# Patient Record
Sex: Female | Born: 1960 | Race: Black or African American | Hispanic: No | Marital: Married | Smoking: Never smoker
Health system: Southern US, Community
[De-identification: ages and names within clinical notes are randomized; demographics above are authoritative.]

## PROBLEM LIST (undated history)

## (undated) DIAGNOSIS — T8859XA Other complications of anesthesia, initial encounter: Secondary | ICD-10-CM

## (undated) DIAGNOSIS — S83241D Other tear of medial meniscus, current injury, right knee, subsequent encounter: Secondary | ICD-10-CM

## (undated) DIAGNOSIS — F419 Anxiety disorder, unspecified: Secondary | ICD-10-CM

## (undated) DIAGNOSIS — F32A Depression, unspecified: Secondary | ICD-10-CM

## (undated) DIAGNOSIS — F329 Major depressive disorder, single episode, unspecified: Secondary | ICD-10-CM

## (undated) DIAGNOSIS — I10 Essential (primary) hypertension: Secondary | ICD-10-CM

## (undated) DIAGNOSIS — T4145XA Adverse effect of unspecified anesthetic, initial encounter: Secondary | ICD-10-CM

## (undated) HISTORY — PX: TUBAL LIGATION: SHX77

---

## 1999-03-22 ENCOUNTER — Other Ambulatory Visit: Admission: RE | Admit: 1999-03-22 | Discharge: 1999-03-22 | Payer: Self-pay | Admitting: Obstetrics and Gynecology

## 1999-05-15 ENCOUNTER — Encounter: Payer: Self-pay | Admitting: Obstetrics and Gynecology

## 1999-05-15 ENCOUNTER — Ambulatory Visit (HOSPITAL_COMMUNITY): Admission: RE | Admit: 1999-05-15 | Discharge: 1999-05-15 | Payer: Self-pay | Admitting: Obstetrics and Gynecology

## 1999-09-26 ENCOUNTER — Encounter (INDEPENDENT_AMBULATORY_CARE_PROVIDER_SITE_OTHER): Payer: Self-pay | Admitting: Specialist

## 1999-09-26 ENCOUNTER — Inpatient Hospital Stay (HOSPITAL_COMMUNITY): Admission: AD | Admit: 1999-09-26 | Discharge: 1999-09-28 | Payer: Self-pay | Admitting: Obstetrics and Gynecology

## 2004-01-13 ENCOUNTER — Other Ambulatory Visit: Admission: RE | Admit: 2004-01-13 | Discharge: 2004-01-13 | Payer: Self-pay | Admitting: Obstetrics and Gynecology

## 2004-01-19 ENCOUNTER — Encounter (INDEPENDENT_AMBULATORY_CARE_PROVIDER_SITE_OTHER): Payer: Self-pay | Admitting: *Deleted

## 2004-01-19 ENCOUNTER — Ambulatory Visit (HOSPITAL_COMMUNITY): Admission: RE | Admit: 2004-01-19 | Discharge: 2004-01-19 | Payer: Self-pay | Admitting: Obstetrics and Gynecology

## 2004-07-24 ENCOUNTER — Ambulatory Visit (HOSPITAL_COMMUNITY): Admission: RE | Admit: 2004-07-24 | Discharge: 2004-07-24 | Payer: Self-pay | Admitting: Family Medicine

## 2004-09-01 ENCOUNTER — Ambulatory Visit (HOSPITAL_COMMUNITY): Admission: RE | Admit: 2004-09-01 | Discharge: 2004-09-01 | Payer: Self-pay | Admitting: Family Medicine

## 2004-09-04 ENCOUNTER — Other Ambulatory Visit: Admission: RE | Admit: 2004-09-04 | Discharge: 2004-09-04 | Payer: Self-pay | Admitting: Obstetrics and Gynecology

## 2004-12-07 ENCOUNTER — Ambulatory Visit (HOSPITAL_COMMUNITY): Admission: RE | Admit: 2004-12-07 | Discharge: 2004-12-07 | Payer: Self-pay | Admitting: Urology

## 2005-07-25 ENCOUNTER — Ambulatory Visit (HOSPITAL_COMMUNITY): Admission: RE | Admit: 2005-07-25 | Discharge: 2005-07-25 | Payer: Self-pay | Admitting: Orthopedic Surgery

## 2005-07-27 ENCOUNTER — Encounter: Payer: Self-pay | Admitting: Family Medicine

## 2005-10-31 ENCOUNTER — Ambulatory Visit (HOSPITAL_COMMUNITY): Admission: RE | Admit: 2005-10-31 | Discharge: 2005-10-31 | Payer: Self-pay | Admitting: Orthopedic Surgery

## 2005-11-01 ENCOUNTER — Ambulatory Visit (HOSPITAL_COMMUNITY): Admission: RE | Admit: 2005-11-01 | Discharge: 2005-11-02 | Payer: Self-pay | Admitting: Neurosurgery

## 2006-10-29 HISTORY — PX: CERVICAL FUSION: SHX112

## 2007-11-13 ENCOUNTER — Ambulatory Visit: Payer: Self-pay | Admitting: Family Medicine

## 2007-12-05 ENCOUNTER — Ambulatory Visit (HOSPITAL_COMMUNITY): Admission: RE | Admit: 2007-12-05 | Discharge: 2007-12-05 | Payer: Self-pay | Admitting: Family Medicine

## 2008-01-07 DIAGNOSIS — R109 Unspecified abdominal pain: Secondary | ICD-10-CM | POA: Insufficient documentation

## 2008-01-07 DIAGNOSIS — E663 Overweight: Secondary | ICD-10-CM | POA: Insufficient documentation

## 2008-02-19 ENCOUNTER — Encounter: Payer: Self-pay | Admitting: Family Medicine

## 2008-02-19 LAB — CONVERTED CEMR LAB
ALT: 8 units/L (ref 0–35)
Albumin: 4.4 g/dL (ref 3.5–5.2)
Alkaline Phosphatase: 64 units/L (ref 39–117)
Basophils Absolute: 0 10*3/uL (ref 0.0–0.1)
Basophils Relative: 0 % (ref 0–1)
CO2: 23 meq/L (ref 19–32)
Calcium: 9.4 mg/dL (ref 8.4–10.5)
Cholesterol: 131 mg/dL (ref 0–200)
Creatinine, Ser: 0.73 mg/dL (ref 0.40–1.20)
Eosinophils Absolute: 0.1 10*3/uL (ref 0.0–0.7)
Eosinophils Relative: 3 % (ref 0–5)
Glucose, Bld: 96 mg/dL (ref 70–99)
HCT: 39.1 % (ref 36.0–46.0)
Lymphocytes Relative: 40 % (ref 12–46)
Lymphs Abs: 1.8 10*3/uL (ref 0.7–4.0)
MCHC: 32 g/dL (ref 30.0–36.0)
Monocytes Relative: 8 % (ref 3–12)
Platelets: 205 10*3/uL (ref 150–400)
RDW: 13.2 % (ref 11.5–15.5)
TSH: 1.371 microintl units/mL (ref 0.350–5.50)
Total Bilirubin: 0.5 mg/dL (ref 0.3–1.2)
Total CHOL/HDL Ratio: 2.8
Triglycerides: 60 mg/dL (ref <150)
VLDL: 12 mg/dL (ref 0–40)
WBC: 4.5 10*3/uL (ref 4.0–10.5)

## 2009-09-06 ENCOUNTER — Ambulatory Visit: Payer: Self-pay | Admitting: Family Medicine

## 2009-09-06 DIAGNOSIS — R5381 Other malaise: Secondary | ICD-10-CM | POA: Insufficient documentation

## 2009-09-06 DIAGNOSIS — R5383 Other fatigue: Secondary | ICD-10-CM | POA: Insufficient documentation

## 2009-09-06 DIAGNOSIS — R42 Dizziness and giddiness: Secondary | ICD-10-CM | POA: Insufficient documentation

## 2009-09-13 ENCOUNTER — Ambulatory Visit (HOSPITAL_COMMUNITY): Admission: RE | Admit: 2009-09-13 | Discharge: 2009-09-13 | Payer: Self-pay | Admitting: Family Medicine

## 2009-09-20 ENCOUNTER — Encounter: Payer: Self-pay | Admitting: Family Medicine

## 2009-10-31 ENCOUNTER — Encounter: Payer: Self-pay | Admitting: Family Medicine

## 2009-10-31 LAB — CONVERTED CEMR LAB
ALT: 8 units/L (ref 0–35)
AST: 13 units/L (ref 0–37)
Alkaline Phosphatase: 67 units/L (ref 39–117)
BUN: 10 mg/dL (ref 6–23)
Basophils Absolute: 0 10*3/uL (ref 0.0–0.1)
Bilirubin, Direct: 0.1 mg/dL (ref 0.0–0.3)
Calcium: 9 mg/dL (ref 8.4–10.5)
Creatinine, Ser: 0.67 mg/dL (ref 0.40–1.20)
Glucose, Bld: 84 mg/dL (ref 70–99)
HCT: 36.2 % (ref 36.0–46.0)
Hemoglobin: 12.4 g/dL (ref 12.0–15.0)
Lymphocytes Relative: 45 % (ref 12–46)
Lymphs Abs: 2.1 10*3/uL (ref 0.7–4.0)
MCHC: 34.3 g/dL (ref 30.0–36.0)
Monocytes Absolute: 0.4 10*3/uL (ref 0.1–1.0)
Neutro Abs: 2.1 10*3/uL (ref 1.7–7.7)
Platelets: 226 10*3/uL (ref 150–400)
Potassium: 4 meq/L (ref 3.5–5.3)
RBC: 4.24 M/uL (ref 3.87–5.11)
RDW: 13 % (ref 11.5–15.5)
Sodium: 142 meq/L (ref 135–145)
Total Bilirubin: 0.4 mg/dL (ref 0.3–1.2)
Triglycerides: 51 mg/dL (ref <150)
WBC: 4.6 10*3/uL (ref 4.0–10.5)

## 2009-11-01 ENCOUNTER — Ambulatory Visit: Payer: Self-pay | Admitting: Family Medicine

## 2009-12-22 ENCOUNTER — Encounter (INDEPENDENT_AMBULATORY_CARE_PROVIDER_SITE_OTHER): Payer: Self-pay | Admitting: *Deleted

## 2010-11-18 ENCOUNTER — Encounter: Payer: Self-pay | Admitting: Urology

## 2010-11-19 ENCOUNTER — Encounter: Payer: Self-pay | Admitting: Family Medicine

## 2010-11-28 NOTE — Assessment & Plan Note (Signed)
Summary: OFFICE VISIT   Vital Signs:  Patient profile:   50 year old female Menstrual status:  regular LMP:     11/01/2009 Height:      64.5 inches Weight:      208 pounds BMI:     35.28 O2 Sat:      97 % Pulse rate:   68 / minute Pulse rhythm:   regular Resp:     16 per minute BP sitting:   120 / 82 Cuff size:   large  Vitals Entered By: Everitt Amber (November 01, 2009 11:19 AM)  Nutrition Counseling: Patient's BMI is greater than 25 and therefore counseled on weight management options. CC: Follow up chronic problems LMP (date): 11/01/2009     Enter LMP: 11/01/2009   Primary Care Provider:  Syliva Overman MD  CC:  Follow up chronic problems.  History of Present Illness: Pt reports acute left flank pain 2 days ago, this is the first episode. She ststes it was colicky in nature, she is currently on her menses, so is unable to report any associated hematuria. The pain was not aggravated by movememnt, and appeared to radiate to her groin. she denies any associated fever, chills, dysuria or frequency. She still hs to schedule an appt to fully evaluate her vertigo. Sincwe her last visit here she has not had any severe recurrences, nor has she had to use the tramadol for severe abdominal pain. she repoorts a commitment to daily exercise for at lest since jher last visit. She feels better. She does not want to weigh, by her report, her caloric intake is very little.   Current Medications (verified): 1)  Antivert 25 Mg Tabs (Meclizine Hcl) .... Take 1 Tablet By Mouth Three Times A Day As Needed 2)  Tramadol Hcl 50 Mg Tabs (Tramadol Hcl) .... One To Two Tablets At Night As Needed  Allergies (verified): 1)  ! * Shrimp  Review of Systems      See HPI General:  Denies chills, fatigue, and fever. Eyes:  Denies blurring and discharge. ENT:  Denies hoarseness, nasal congestion, and sinus pressure. CV:  Denies chest pain or discomfort, palpitations, and swelling of  feet. Resp:  Denies cough and sputum productive. GU:  Denies dysuria and urinary frequency.  Physical Exam  General:  Well-developed,ovewrweight appearing,in no acute distress; alert,appropriate and cooperative throughout examination HEENT: No facial asymmetry,  EOMI, No sinus tenderness, TM's Clear, oropharynx  pink and moist.   Chest: Clear to auscultation bilaterally.  CVS: S1, S2, No murmurs, No S3.   Abd: Soft, Nontender. Left renal angle slightly tender MS: Adequate ROM spine, hips, shoulders and knees.  Ext: No edema.   CNS: CN 2-12 intact, power tone and sensation normal throughout.   Skin: Intact, no visible lesions or rashes.  Psych: Good eye contact, normal affect.  Memory intact, not anxious or depressed appearing.    Impression & Recommendations:  Problem # 1:  FLANK PAIN, LEFT (ICD-789.09) Assessment Comment Only  Her updated medication list for this problem includes:    Tramadol Hcl 50 Mg Tabs (Tramadol hcl) ..... One to two tablets at night as needed  Orders: Radiology Referral (Radiology)  Problem # 2:  VERTIGO (ICD-780.4) Assessment: Unchanged  Her updated medication list for this problem includes:    Antivert 25 Mg Tabs (Meclizine hcl) .Marland Kitchen... Take 1 tablet by mouth three times a day as needed  Problem # 3:  PELVIC PAIN, CHRONIC (ICD-789.09) Assessment: Unchanged  Her updated  medication list for this problem includes:    Tramadol Hcl 50 Mg Tabs (Tramadol hcl) ..... One to two tablets at night as needed  Problem # 4:  OBESITY, MILD (ICD-278.02) Assessment: Comment Only  Ht: 64.5 (11/01/2009)   Wt: 208 (11/01/2009)   BMI: 35.28 (11/01/2009)  Complete Medication List: 1)  Antivert 25 Mg Tabs (Meclizine hcl) .... Take 1 tablet by mouth three times a day as needed 2)  Tramadol Hcl 50 Mg Tabs (Tramadol hcl) .... One to two tablets at night as needed  Patient Instructions: 1)  F/U as before 2)  It is important that you exercise regularly at least 30  minutes75 times a week. If you develop chest pain, have severe difficulty breathing, or feel very tired , stop exercising immediately and seek medical attention.  CONGRATS on your new activity. 3)  You will be referred for an ultrasound of your left flank to evaluate the flank pain.

## 2010-11-28 NOTE — Letter (Signed)
Summary: 1st Missed Appt.  [facility]  [address]   [place], [place] [address]   Phone: [phone]  Fax: [phone]    December 22, 2009  MRN: [id number]  [facility] [place] [address] [place], [place]  [address]  Dear Ms. Madera-BAKER,  At [facility] [place], we make every attempt to fit patients into our schedule by reserving several appointment slots for same-day appointments.  However, we cannot always make appointments for patients the same day they are calling.  At the end of the day, we look back at our schedule and find that because of last-minute cancellations and patients not showing up for their scheduled appointments, we have several appointment slots that are left open and could have been used by another person who really needed it.  In the past, you may have been one of the patients who could not get in when you needed to.  But recently, you were one of the patients with an appointment that you didn't show up for or canceled too late for [place] to fill it.  We choose not to charge no-show or last minute cancellation fees to our patients, like many other offices do.  We do not wish to institute that policy and hope we never have to.  However, we kindly request that you assist [place] by providing at least 24 hours' notice if you can't make your appointment.  If no-shows or late cancellations become habitual (i.e. Three or more in a one-year period), we may terminate the physician-patient relationship.    Thank you for your consideration and cooperation.   Altamease Oiler

## 2011-03-16 NOTE — Op Note (Signed)
NAME:  Deborah Kennedy, Deborah Kennedy                     ACCOUNT NO.:  [id number]   MEDICAL RECORD NO.:  [id number]                   PATIENT TYPE:  AMB   LOCATION:  DAY                                  FACILITY:  [facility]   PHYSICIAN:  Zenaida Niece, M.D.             DATE OF BIRTH:  [birth year]   DATE OF PROCEDURE:  01/19/2004  DATE OF DISCHARGE:                                 OPERATIVE REPORT   PREOPERATIVE DIAGNOSIS:  Pelvic pain and right ovarian lesion.   POSTOPERATIVE DIAGNOSIS:  Pelvic pain, uterine myomas and right ovarian  lesion.   PROCEDURE:  Laparoscopy with minimal adhesiolysis and fulguration of  previous site of tubal ligation.  Removal of right ovarian lesion.   SURGEON:  Zenaida Niece, M.D.   ANESTHESIA:  General endotracheal.   ESTIMATED BLOOD LOSS:  Less than 50 cc.   FINDINGS:  Showed a 2-to-3-cm, right, fundal myoma; a 1-to-2-cm, irregular  nodule medially on the right ovary.  Left ovary appeared normal.  Both tubes  had evidence of previous tubal ligation, but the ends of the tubes appeared  fairly closely approximated.  She had a normal appendix, liver edge,  gallbladder, and upper abdomen.   PROCEDURE IN DETAIL:  The patient was taken to the operating room and placed  in the dorsal supine position. General anesthesia was induced and she was  placed in mobile stirrups.  Abdomen was prepped and draped in the usual  sterile fashion.  Bladder drained with a red rubber catheter.  Hulka  tenaculum applied to the cervix for uterine manipulation.  Infraumbilical  skin was infiltrated with 1/4% Marcaine and a 1.5 cm horizontal incision was  made along her previous scar. The 10-11, disposable trocar was then  introduced and placement confirmed by the laparoscope.  A 5 mm port was then  placed under direct visualization, low in the midline.  Inspection revealed  the above-mentioned findings.  Appendix was freely mobile.  Right ovary was  mobile without adhesions and  did have a 1-2 cm nodule medially.  The middle  of the tube was adherent to the pelvic sidewall and this was taken down  sharply.  I could see where the tubal had been performed, but the ends were  closely approximated.  Both sides of this previous ligation were fulgurated  cautery to assure sterility.   The anterior cul-de-sac, posterior cul-de-sac, and ovarian fossa appeared  normal without evidence of endometriosis.  The left tube and ovary appeared  normal, again with closely approximated ends of the previously ligated tube.  On this side, also both ends of the tube that had been previously ligated  were fulgurated with bipolar cautery.   Attention was turned to the lesion on the right ovary.  This was grasped  with a clamp with teeth.  Bipolar cautery was used to take an initial  incision into the ovary.  Sharp scissors were then used to remove the nodule  sharply.  The ovary was then coagulated with bipolar cautery for hemostasis  with good hemostasis achieved.  Inspection revealed no further lesions.  The  ovarian nodule that was removed was grasped through the operating scope and  removed with the trocar in 2 pieces.  This was sent for final pathology.  The trocar was then reintroduced into the abdomen without difficulty and  visualization revealed all sites to be hemostatic.  The 5 mm port was then  removed under direct visualization.  The laparoscope was removed and all gas  allowed to deflate from the abdomen.  The umbilical trocar was then removed.   All skin incisions were closed with interrupted subcuticular sutures of 4-0  Vicryl, followed by Steri-Strips and Band-Aids.  The Hulka tenaculum was  then removed from the cervix. The patient was awakened in the operating  room, tolerated the procedure well and was taken to the recovery room in  stable condition.  Counts were correct x2 and she received no antibiotics.                                               Zenaida Niece, M.D.    TDM/MEDQ  D:  01/19/2004  T:  01/20/2004  Job:  [id number]

## 2011-03-16 NOTE — Op Note (Signed)
[facility]  Patient:    Deborah Kennedy               MRN: [id number] Proc. Date: 09/27/99 Adm. Date:  [id number] Attending:  Michaele Offer                           Operative Report  PREOPERATIVE DIAGNOSIS:       Desired surgical sterility.  POSTOPERATIVE DIAGNOSIS:      Desired surgical sterility.  OPERATION:                    Bilateral partial salpingectomy.  SURGEON:                      Zenaida Niece, M.D.  ASSISTANT:  ANESTHESIA:                   Epidural.  ESTIMATED BLOOD LOSS:         Less than 50 cc.  FINDINGS:                     Normal anatomy.  COUNTS:                       Correct.  CONDITION:                    Stable.  DESCRIPTION OF PROCEDURE:     After appropriate informed consent was obtained, he patient was taken to the operating room and placed in the dorsal supine position. Her previously placed epidural was dosed up appropriately and her abdomen prepped and draped in the usual sterile fashion.  The level of her anesthesia was found to be adequate and the infraumbilical skin was infiltrated with 0.25% Marcaine.  A 3 cm horizontal skin incision was made, the fascia was identified and entered sharply followed by the peritoneal cavity.  The bowels were packed back with a moist lap pad and both fallopian tubes were identified and traced to their fimbriated ends. A knuckle of tube was grasped on each side with Babcock clamp and ligated with 0 plain gut suture.  The knuckle of tube was removed on each side sharply, both ostia identified, and both stumps were hemostatic.  The fascia and peritoneum were then closed in a running fashion with 0 Vicryl and the skin closed with a running subcuticular suture of 4-0 Vicryl.  A sterile dressing was applied.  The patient tolerated the procedure well and was taken to the recovery room in stable condition. DD:  09/27/99 TD:  09/27/99 Job:  [id number]

## 2011-03-16 NOTE — Op Note (Signed)
Deborah Kennedy, Deborah Kennedy     ACCOUNT NO.:  [id number]   MEDICAL RECORD NO.:  [id number]          PATIENT TYPE:  OIB   LOCATION:  3022                         FACILITY:  [facility]   PHYSICIAN:  Danae Orleans. Venetia Maxon, M.D.  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   OPERATION PERFORMED:  Anterior cervical decompression and fusion C5-6 level  with PEEK interbody cage, morcellized bone autograft and anterior cervical  plate.   SURGEON:  Danae Orleans. Venetia Maxon, M.D.   ASSISTANT:  Coletta Memos, M.D.   ANESTHESIA:  General endotracheal.   ESTIMATED BLOOD LOSS:  Minimal.   COMPLICATIONS:  None.   DISPOSITION:  Recovery.   INDICATIONS FOR PROCEDURE:  Deborah Kennedy is a 50 year old woman  with a large herniated disk at C5-6 with severe left arm pain and weakness.  It was elected to take her to surgery for anterior cervical decompression  and fusion.   DESCRIPTION OF PROCEDURE:  Deborah Kennedy was brought to the operating  room.  Following satisfactory and uncomplicated induction of general  endotracheal anesthesia and placement of intravenous lines, the patient was  placed in supine position on the operating table.  The neck was placed in  slight extension and she was placed in 10 pounds of halter traction.  The  anterior neck was then prepped and draped in the usual sterile fashion.  The  area of planned incision was infiltrated with 0.25% Marcaine and 0.5%  lidocaine 1:200,000 epinephrine.  Incision was made from the midline to the  anterior border of the sternocleidomastoid muscle on the left side of  midline and carried to the platysma  layer.  Subplatysmal dissection was  performed exposing the anterior border of the sternocleidomastoid muscle.  Using blunt dissection, the carotid sheath was kept lateral, the trachea and  esophagus kept medial exposing the anterior cervical spine.  A bent spinal  needle was placed at what was felt to be the C5-6 level and this was  confirmed on intraoperative x-ray.  Subsequently, the longus colli muscles  were taken down from the anterior cervical spine from C5 through C6  bilaterally using electrocautery and Key elevator.  Self-retaining retractor  was placed to facilitate exposure and the C5-6 level was then incised with a  15 blade and disk material was removed in piecemeal fashion.  End plates  were stripped of residual disk material and the microscope was brought into  the field and using high power microscopic visualization, the end plates of  C5 and C6 were decorticated and uncinate spurs were drilled down.  The  posterior longitudinal ligament was then incised with an arachnoid knife and  was removed decompressing the central spinal cord dura, initially the right  neural foramen and subsequently the left neural foramen.  On the left side  of midline there was a large disk herniation and multiple fragments of  herniated disk material were removed with resultant significant  decompression of the left side of the spinal cord and also the left C6 nerve  root.  This was decompressed widely as it extended out the neural foramen.  Hemostasis was assured with Gelfoam soaked in Thrombin.  A nerve hook was  passed easily over the nerve root. It was felt to be well decompressed.  Subsequently after trial sizing, a 7 mm PEEK interbody cage was selected,  packed with morcellized bone autograft which was retained from the drillings  of the end plates. This was then inserted in the interspace and countersunk  appropriately.  A 14 mm anterior cervical plate was then affixed to the  anterior  cervical spine using 14 mm variable angle screws, two at C5 and two  at C6.  All screws had excellent purchase.  Locking mechanisms were engaged.  Final x-ray demonstrated well positioned interbody graft and anterior  cervical plate.  Prior to placing the screws, the halter traction was  removed.  Hemostasis of the soft tissues was obtained with Bovie  electrocautery and soft tissues were inspected and found to be in good  repair.  Subsequently, the platysma layer was reapproximated with 3-0 Vicryl  sutures and skin edges were reapproximated with interrupted 3-0 Vicryl  subcuticular stitch.  The wound was dressed with Dermabond.  The patient was  extubated in the operating room and taken to the recovery room in stable and  satisfactory condition having tolerated the operation well.  Counts were  correct at the end of the case.      Danae Orleans. Venetia Maxon, M.D.  Electronically Signed     JDS/MEDQ  D:  11/01/2005  T:  11/02/2005  Job:  [id number]

## 2011-05-03 ENCOUNTER — Other Ambulatory Visit: Payer: Self-pay | Admitting: Obstetrics and Gynecology

## 2011-05-03 ENCOUNTER — Other Ambulatory Visit (HOSPITAL_COMMUNITY)
Admission: RE | Admit: 2011-05-03 | Discharge: 2011-05-03 | Disposition: A | Discharge status: Home / Self Care | Payer: 59 | Source: Ambulatory Visit | Attending: Obstetrics and Gynecology | Admitting: Obstetrics and Gynecology

## 2011-05-03 DIAGNOSIS — Z1159 Encounter for screening for other viral diseases: Secondary | ICD-10-CM | POA: Insufficient documentation

## 2011-05-03 DIAGNOSIS — Z124 Encounter for screening for malignant neoplasm of cervix: Secondary | ICD-10-CM | POA: Insufficient documentation

## 2011-08-15 ENCOUNTER — Other Ambulatory Visit: Payer: Self-pay | Admitting: Orthopedic Surgery

## 2011-08-15 DIAGNOSIS — M66879 Spontaneous rupture of other tendons, unspecified ankle and foot: Secondary | ICD-10-CM

## 2011-08-17 ENCOUNTER — Ambulatory Visit (HOSPITAL_COMMUNITY)
Admission: RE | Admit: 2011-08-17 | Discharge: 2011-08-17 | Disposition: A | Discharge status: Home / Self Care | Payer: 59 | Source: Ambulatory Visit | Attending: Orthopedic Surgery | Admitting: Orthopedic Surgery

## 2011-08-17 DIAGNOSIS — M659 Synovitis and tenosynovitis, unspecified: Secondary | ICD-10-CM | POA: Insufficient documentation

## 2011-08-17 DIAGNOSIS — M65979 Unspecified synovitis and tenosynovitis, unspecified ankle and foot: Secondary | ICD-10-CM | POA: Insufficient documentation

## 2011-08-17 DIAGNOSIS — M25579 Pain in unspecified ankle and joints of unspecified foot: Secondary | ICD-10-CM | POA: Insufficient documentation

## 2011-08-17 DIAGNOSIS — M7660 Achilles tendinitis, unspecified leg: Secondary | ICD-10-CM | POA: Insufficient documentation

## 2011-08-17 DIAGNOSIS — M66879 Spontaneous rupture of other tendons, unspecified ankle and foot: Secondary | ICD-10-CM

## 2011-10-08 ENCOUNTER — Other Ambulatory Visit: Payer: Self-pay | Admitting: Obstetrics and Gynecology

## 2011-10-09 ENCOUNTER — Encounter (HOSPITAL_COMMUNITY): Payer: Self-pay | Admitting: Pharmacy Technician

## 2011-10-11 ENCOUNTER — Encounter (HOSPITAL_COMMUNITY): Admission: RE | Admit: 2011-10-11 | Payer: 59 | Source: Ambulatory Visit

## 2011-10-15 ENCOUNTER — Encounter (HOSPITAL_COMMUNITY): Payer: Self-pay

## 2011-10-15 ENCOUNTER — Other Ambulatory Visit: Payer: Self-pay | Admitting: Obstetrics and Gynecology

## 2011-10-15 ENCOUNTER — Encounter (HOSPITAL_COMMUNITY)
Admission: RE | Admit: 2011-10-15 | Discharge: 2011-10-15 | Disposition: A | Discharge status: Home / Self Care | Payer: 59 | Source: Ambulatory Visit | Attending: Obstetrics and Gynecology | Admitting: Obstetrics and Gynecology

## 2011-10-15 HISTORY — DX: Other complications of anesthesia, initial encounter: T88.59XA

## 2011-10-15 HISTORY — DX: Adverse effect of unspecified anesthetic, initial encounter: T41.45XA

## 2011-10-15 LAB — COMPREHENSIVE METABOLIC PANEL
Albumin: 3.8 g/dL (ref 3.5–5.2)
Alkaline Phosphatase: 79 U/L (ref 39–117)
CO2: 27 mEq/L (ref 19–32)
Calcium: 9.9 mg/dL (ref 8.4–10.5)
Chloride: 106 mEq/L (ref 96–112)
GFR calc Af Amer: >90 mL/min (ref >90)
GFR calc non Af Amer: >90 mL/min (ref >90)
Glucose, Bld: 106 mg/dL — ABNORMAL HIGH (ref 70–99)
Potassium: 4.1 mEq/L (ref 3.5–5.1)

## 2011-10-15 LAB — URINALYSIS, ROUTINE W REFLEX MICROSCOPIC
Bilirubin Urine: NEGATIVE
Glucose, UA: NEGATIVE mg/dL
Specific Gravity, Urine: 1.03 (ref 1.005–1.030)
Urobilinogen, UA: 0.2 mg/dL (ref 0.0–1.0)
pH: 6 (ref 5.0–8.0)

## 2011-10-15 LAB — URINE MICROSCOPIC-ADD ON

## 2011-10-15 LAB — CBC
MCH: 28.8 pg (ref 26.0–34.0)
MCV: 86.5 fL (ref 78.0–100.0)
RDW: 12.9 % (ref 11.5–15.5)
WBC: 4.4 10*3/uL (ref 4.0–10.5)

## 2011-10-15 LAB — SURGICAL PCR SCREEN
MRSA, PCR: NEGATIVE
Staphylococcus aureus: NEGATIVE

## 2011-10-15 LAB — TYPE AND SCREEN: ABO/RH(D): AB POS

## 2011-10-15 NOTE — Patient Instructions (Signed)
20 Greer Peppard-Baker  10/15/2011   Your procedure is scheduled on:  10/18/11  Report to [facility] at 0945 AM.  Call this number if you have problems the morning of surgery: [phone]   Remember:   Do not eat food:After Midnight.  May have clear liquids:until Midnight .  Clear liquids include soda, tea, black coffee, apple or grape juice, broth.  Take these medicines the morning of surgery with A SIP OF WATER: Tylenol   Do not wear jewelry, make-up or nail polish.  Do not wear lotions, powders, or perfumes. You may wear deodorant.  Do not shave 48 hours prior to surgery.  Do not bring valuables to the hospital.  Contacts, dentures or bridgework may not be worn into surgery.  Leave suitcase in the car. After surgery it may be brought to your room.  For patients admitted to the hospital, checkout time is 11:00 AM the day of discharge.   Patients discharged the day of surgery will not be allowed to drive home.  Name and phone number of your driver: husband  Special Instructions: CHG Shower Use Special Wash: 1/2 bottle night before surgery and 1/2 bottle morning of surgery.   Please read over the following fact sheets that you were given: Pain Booklet, Surgical Site Infection Prevention and Care and Recovery After Surgery\

## 2011-10-17 NOTE — H&P (Signed)
Deborah Kennedy is an 50 y.o. female. She is a gravida 2 para 2 status post tubal ligation with LMP 09/10/2011 she is admitted for abdominal hysterectomy due to symptomatic uterine fibroids which cause her chronic daily debilitating pain in the evenings for approximately 2-2-1/2 weeks just before each of her menses. She has one good week of pain-free existence [place]. The pain is reproduced by uterine contact. The uterus is enlarged at 16 weeks size. Ultrasound has been performed which shows the uterus to be 8.8 x 5.5 x 4.8 cm with an exophytic fundal fibroid measuring 8.8 cm in maximum diameter. Right and left ovaries are visualized and normal. Endometrial biopsy has been performed but and it was benign 10/09/2011. Pap smear was performed 05/03/2011 interpreted as ASCUS with negative HPV. This is treated as a normal. She has an enlarged bulbous cervix. She is admitted for abdominal hysterectomy. In order to improve access to the pelvis plans are to excise an ellipse of skin and underlying subcutaneous tissues along the Pfannenstiel incision. This should reduce postoperative scarring in skin overlap as well. This has been scant out with the patient who understands the alternatives risks and benefits to this portion of the procedure. The patient did not return preoperatively in time to consider suppression of the fibroids with Lupron. She desires to proceed at this time with surgery rather than delay surgery sufficiently to allow uterine suppression with Lupron.  Pertinent Gynecological History: Menses: flow is moderate, regular every month without intermenstrual spotting and usually lasting 5 to 6 days Bleeding:  Contraception: tubal ligation DES exposure: denies Blood transfusions: none Sexually transmitted diseases: no past history Previous GYN Procedures: tubal ligation  Last mammogram: normal Date: by pt hx Last pap: ASCUS - hpv Date: 05/03/2011 OB History: G2, P2002   Menstrual  History: Menarche age:  No LMP recorded. 09/10/2011    Past Medical History  Diagnosis Date  . Complication of anesthesia     reaction to epidural medication    Past Surgical History  Procedure Date  . Tubal ligation   . Cervical fusion 2008    No family history on file.  Social History:  reports that she has never smoked. She does not have any smokeless tobacco history on file. She reports that she does not drink alcohol or use illicit drugs.  Allergies: Not on File  No prescriptions prior to admission    ROS review of systems is positive for torn tendons in her right foot as a walking boot which she would has been wearing for several months she has declined to proceed with surgery so far but is considering that in the future  There were no vitals taken for this visit.  Physical Exam Physical Examination: General appearance - alert, well appearing, and in no distress, oriented to person, place, and time, overweight and anxious Mental status - alert, oriented to person, place, and time, normal mood, behavior, speech, dress, motor activity, and thought processes, anxious,  Mouth - mucous membranes moist, pharynx normal without lesions and dental hygiene good Chest - clear to auscultation, no wheezes, rales or rhonchi, symmetric air entry Heart - normal rate and regular rhythm Abdomen - tenderness noted in right lower quadrant when palpating over the top of the uterine fibroids which makes the uterine estimated size equal to 14-16 week size uterus the fibroids seem to fill the pelvis sufficiently laparoscopic procedure considered technically challenging bowel sounds normal Positioning of planned incision sketched out with patient Pelvic - VULVA: normal appearing vulva  with no masses, tenderness or lesions, VAGINA: normal appearing vagina with normal color and discharge, no lesions uterus enlarged as described above cervix multiparous large 4 cm transverse diameter  Extremities -  peripheral pulses normal, no pedal edema, no clubbing or cyanosis, Homan's sign negative bilaterally, patient has some swelling of the right ankle this chronic residual status post tendon injuries there is no suspicion of DVT at this time  No results found for this or any previous visit (from the past 24 hour(s)). CBC    Component Value Date/Time   WBC 4.4 10/15/2011 0923   RBC 4.37 10/15/2011 0923   HGB 12.6 10/15/2011 0923   HCT 37.8 10/15/2011 0923   PLT 225 10/15/2011 0923   MCV 86.5 10/15/2011 0923   MCH 28.8 10/15/2011 0923   MCHC 33.3 10/15/2011 0923   RDW 12.9 10/15/2011 0923   LYMPHSABS 2.1 10/31/2009 1842   MONOABS 0.4 10/31/2009 1842   EOSABS 0.1 10/31/2009 1842   BASOSABS 0.0 10/31/2009 1842    No results found.  Assessment/Plan: risks of procedure reviewed with patient. Ovarian preservation planned, with excision of skin and subcutaneous fat as needed improved pelvic access and incision positioning. Uterine fibroids 14-16 weeks size , symptomatic,scheduled for abdominal hysterectomy through a Pfannensteil incision.   Kassy Mcenroe V 10/17/2011, 1:27 PM

## 2011-10-18 ENCOUNTER — Encounter (HOSPITAL_COMMUNITY): Payer: Self-pay | Admitting: Anesthesiology

## 2011-10-18 ENCOUNTER — Other Ambulatory Visit: Payer: Self-pay | Admitting: Obstetrics and Gynecology

## 2011-10-18 ENCOUNTER — Ambulatory Visit (HOSPITAL_COMMUNITY): Payer: 59 | Admitting: Anesthesiology

## 2011-10-18 ENCOUNTER — Inpatient Hospital Stay (HOSPITAL_COMMUNITY)
Admission: RE | Admit: 2011-10-18 | Discharge: 2011-10-21 | DRG: 743 | Disposition: A | Discharge status: Home / Self Care | Payer: 59 | Source: Ambulatory Visit | Attending: Obstetrics and Gynecology | Admitting: Obstetrics and Gynecology

## 2011-10-18 ENCOUNTER — Encounter (HOSPITAL_COMMUNITY)
Admission: RE | Disposition: A | Discharge status: Home / Self Care | Payer: Self-pay | Source: Ambulatory Visit | Attending: Obstetrics and Gynecology

## 2011-10-18 ENCOUNTER — Encounter (HOSPITAL_COMMUNITY): Payer: Self-pay | Admitting: *Deleted

## 2011-10-18 DIAGNOSIS — D251 Intramural leiomyoma of uterus: Secondary | ICD-10-CM | POA: Diagnosis present

## 2011-10-18 DIAGNOSIS — D259 Leiomyoma of uterus, unspecified: Principal | ICD-10-CM | POA: Diagnosis present

## 2011-10-18 DIAGNOSIS — R1031 Right lower quadrant pain: Secondary | ICD-10-CM | POA: Diagnosis present

## 2011-10-18 HISTORY — PX: ABDOMINAL HYSTERECTOMY: SHX81

## 2011-10-18 SURGERY — HYSTERECTOMY, ABDOMINAL
Anesthesia: General | Site: Abdomen | Wound class: Clean Contaminated

## 2011-10-18 MED ORDER — KETOROLAC TROMETHAMINE 30 MG/ML IJ SOLN
INTRAMUSCULAR | Status: AC
  Administered 2011-10-18: 30 mg via INTRAVENOUS
  Filled 2011-10-18: qty 1

## 2011-10-18 MED ORDER — BUPIVACAINE HCL (PF) 0.5 % IJ SOLN
INTRAMUSCULAR | Status: AC
  Filled 2011-10-18: qty 30

## 2011-10-18 MED ORDER — KCL IN DEXTROSE-NACL 20-5-0.45 MEQ/L-%-% IV SOLN
INTRAVENOUS | Status: DC
  Administered 2011-10-18 – 2011-10-19 (×5): via INTRAVENOUS
  Administered 2011-10-20: 1000 mL via INTRAVENOUS

## 2011-10-18 MED ORDER — ROCURONIUM BROMIDE 50 MG/5ML IV SOLN
INTRAVENOUS | Status: AC
  Filled 2011-10-18: qty 1

## 2011-10-18 MED ORDER — MIDAZOLAM HCL 5 MG/5ML IJ SOLN
INTRAMUSCULAR | Status: DC | PRN
  Administered 2011-10-18: 2 mg via INTRAVENOUS

## 2011-10-18 MED ORDER — ONDANSETRON HCL 4 MG/2ML IJ SOLN
4.0000 mg | Freq: Once | INTRAMUSCULAR | Status: AC
  Administered 2011-10-18: 4 mg via INTRAVENOUS

## 2011-10-18 MED ORDER — HYDROMORPHONE 0.3 MG/ML IV SOLN
INTRAVENOUS | Status: AC
  Administered 2011-10-18: 16:00:00
  Filled 2011-10-18: qty 25

## 2011-10-18 MED ORDER — PROPOFOL 10 MG/ML IV EMUL
INTRAVENOUS | Status: AC
  Filled 2011-10-18: qty 20

## 2011-10-18 MED ORDER — PANTOPRAZOLE SODIUM 40 MG IV SOLR
40.0000 mg | Freq: Every day | INTRAVENOUS | Status: AC
  Administered 2011-10-18 – 2011-10-19 (×2): 40 mg via INTRAVENOUS
  Filled 2011-10-18 (×2): qty 40

## 2011-10-18 MED ORDER — SODIUM CHLORIDE 0.9 % IJ SOLN
INTRAMUSCULAR | Status: AC
  Filled 2011-10-18: qty 3

## 2011-10-18 MED ORDER — GLYCOPYRROLATE 0.2 MG/ML IJ SOLN
INTRAMUSCULAR | Status: DC | PRN
  Administered 2011-10-18: .4 mg via INTRAVENOUS

## 2011-10-18 MED ORDER — NEOSTIGMINE METHYLSULFATE 1 MG/ML IJ SOLN
INTRAMUSCULAR | Status: AC
  Filled 2011-10-18: qty 10

## 2011-10-18 MED ORDER — CEFAZOLIN SODIUM-DEXTROSE 2-3 GM-% IV SOLR
INTRAVENOUS | Status: AC
  Filled 2011-10-18: qty 50

## 2011-10-18 MED ORDER — GLYCOPYRROLATE 0.2 MG/ML IJ SOLN
INTRAMUSCULAR | Status: AC
  Filled 2011-10-18: qty 1

## 2011-10-18 MED ORDER — SODIUM CHLORIDE 0.9 % IJ SOLN: 9.0000 mL | INTRAMUSCULAR | Status: DC | PRN

## 2011-10-18 MED ORDER — MIDAZOLAM HCL 2 MG/2ML IJ SOLN
INTRAMUSCULAR | Status: AC
  Filled 2011-10-18: qty 2

## 2011-10-18 MED ORDER — DIPHENHYDRAMINE HCL 50 MG/ML IJ SOLN: 12.5000 mg | Freq: Four times a day (QID) | INTRAMUSCULAR | Status: DC | PRN

## 2011-10-18 MED ORDER — MIDAZOLAM HCL 2 MG/2ML IJ SOLN
1.0000 mg | INTRAMUSCULAR | Status: DC | PRN
  Administered 2011-10-18: 2 mg via INTRAVENOUS

## 2011-10-18 MED ORDER — ROCURONIUM BROMIDE 100 MG/10ML IV SOLN
INTRAVENOUS | Status: DC | PRN
  Administered 2011-10-18: 50 mg via INTRAVENOUS

## 2011-10-18 MED ORDER — FENTANYL CITRATE 0.05 MG/ML IJ SOLN
INTRAMUSCULAR | Status: AC
  Filled 2011-10-18: qty 5

## 2011-10-18 MED ORDER — NALOXONE HCL 0.4 MG/ML IJ SOLN: 0.4000 mg | INTRAMUSCULAR | Status: DC | PRN

## 2011-10-18 MED ORDER — FENTANYL CITRATE 0.05 MG/ML IJ SOLN: 25.0000 ug | INTRAMUSCULAR | Status: DC | PRN

## 2011-10-18 MED ORDER — SODIUM CHLORIDE 0.9 % IJ SOLN
INTRAMUSCULAR | Status: AC
  Administered 2011-10-18: 10 mL
  Filled 2011-10-18: qty 3

## 2011-10-18 MED ORDER — PROPOFOL 10 MG/ML IV BOLUS
INTRAVENOUS | Status: DC | PRN
  Administered 2011-10-18: 160 mg via INTRAVENOUS

## 2011-10-18 MED ORDER — HYDROMORPHONE 0.3 MG/ML IV SOLN
INTRAVENOUS | Status: DC
  Administered 2011-10-18 (×2): 0.6 mg via INTRAVENOUS
  Administered 2011-10-19: 0.3 mg via INTRAVENOUS
  Administered 2011-10-19 (×3): 0.6 mg via INTRAVENOUS
  Administered 2011-10-20 (×4): 0.3 mg via INTRAVENOUS
  Administered 2011-10-21: 0.6 mg via INTRAVENOUS

## 2011-10-18 MED ORDER — ONDANSETRON HCL 4 MG/2ML IJ SOLN
4.0000 mg | Freq: Four times a day (QID) | INTRAMUSCULAR | Status: DC | PRN
  Administered 2011-10-18: 4 mg via INTRAVENOUS
  Filled 2011-10-18: qty 2

## 2011-10-18 MED ORDER — KETOROLAC TROMETHAMINE 30 MG/ML IJ SOLN
30.0000 mg | Freq: Once | INTRAMUSCULAR | Status: AC
  Administered 2011-10-18: 30 mg via INTRAVENOUS

## 2011-10-18 MED ORDER — BUPIVACAINE HCL (PF) 0.5 % IJ SOLN
INTRAMUSCULAR | Status: DC | PRN
  Administered 2011-10-18: 20 mL

## 2011-10-18 MED ORDER — CEFAZOLIN SODIUM 1-5 GM-% IV SOLN
INTRAVENOUS | Status: DC | PRN
  Administered 2011-10-18: 2 g via INTRAVENOUS

## 2011-10-18 MED ORDER — FENTANYL CITRATE 0.05 MG/ML IJ SOLN
INTRAMUSCULAR | Status: DC | PRN
  Administered 2011-10-18: 50 ug via INTRAVENOUS
  Administered 2011-10-18 (×2): 100 ug via INTRAVENOUS
  Administered 2011-10-18 (×3): 50 ug via INTRAVENOUS
  Administered 2011-10-18: 100 ug via INTRAVENOUS

## 2011-10-18 MED ORDER — HYDROMORPHONE HCL PF 1 MG/ML IJ SOLN
INTRAMUSCULAR | Status: AC
  Filled 2011-10-18: qty 1

## 2011-10-18 MED ORDER — HYDROMORPHONE HCL PF 1 MG/ML IJ SOLN
0.5000 mg | INTRAMUSCULAR | Status: DC | PRN
  Administered 2011-10-18 (×5): 0.5 mg via INTRAVENOUS

## 2011-10-18 MED ORDER — LACTATED RINGERS IV SOLN
INTRAVENOUS | Status: DC
  Administered 2011-10-18: 1000 mL via INTRAVENOUS
  Administered 2011-10-18 (×2): via INTRAVENOUS

## 2011-10-18 MED ORDER — MIDAZOLAM HCL 2 MG/2ML IJ SOLN
INTRAMUSCULAR | Status: AC
  Administered 2011-10-18: 2 mg via INTRAVENOUS
  Filled 2011-10-18: qty 2

## 2011-10-18 MED ORDER — ONDANSETRON HCL 4 MG/2ML IJ SOLN
INTRAMUSCULAR | Status: AC
  Administered 2011-10-18: 4 mg via INTRAVENOUS
  Filled 2011-10-18: qty 2

## 2011-10-18 MED ORDER — PROMETHAZINE HCL 25 MG/ML IJ SOLN
12.5000 mg | INTRAMUSCULAR | Status: DC | PRN
  Filled 2011-10-18: qty 1

## 2011-10-18 MED ORDER — ZOLPIDEM TARTRATE 5 MG PO TABS: 5.0000 mg | ORAL_TABLET | Freq: Every evening | ORAL | Status: DC | PRN

## 2011-10-18 MED ORDER — OXYCODONE-ACETAMINOPHEN 5-325 MG PO TABS: 1.0000 | ORAL_TABLET | ORAL | Status: DC | PRN

## 2011-10-18 MED ORDER — CEFAZOLIN SODIUM-DEXTROSE 2-3 GM-% IV SOLR: 2.0000 g | INTRAVENOUS | Status: DC

## 2011-10-18 MED ORDER — LIDOCAINE HCL 1 % IJ SOLN
INTRAMUSCULAR | Status: DC | PRN
  Administered 2011-10-18: 25 mg via INTRADERMAL

## 2011-10-18 MED ORDER — 0.9 % SODIUM CHLORIDE (POUR BTL) OPTIME
TOPICAL | Status: DC | PRN
  Administered 2011-10-18: 2000 mL

## 2011-10-18 MED ORDER — DIPHENHYDRAMINE HCL 12.5 MG/5ML PO ELIX: 12.5000 mg | ORAL_SOLUTION | Freq: Four times a day (QID) | ORAL | Status: DC | PRN

## 2011-10-18 MED ORDER — DOCUSATE SODIUM 100 MG PO CAPS
100.0000 mg | ORAL_CAPSULE | Freq: Two times a day (BID) | ORAL | Status: DC
  Administered 2011-10-19 – 2011-10-21 (×5): 100 mg via ORAL
  Filled 2011-10-18 (×6): qty 1

## 2011-10-18 MED ORDER — KETOROLAC TROMETHAMINE 30 MG/ML IJ SOLN: 30.0000 mg | Freq: Four times a day (QID) | INTRAMUSCULAR | Status: AC

## 2011-10-18 MED ORDER — HYDROMORPHONE HCL PF 1 MG/ML IJ SOLN
INTRAMUSCULAR | Status: AC
  Administered 2011-10-18: 0.5 mg via INTRAVENOUS
  Filled 2011-10-18: qty 1

## 2011-10-18 MED ORDER — NEOSTIGMINE METHYLSULFATE 1 MG/ML IJ SOLN
INTRAMUSCULAR | Status: DC | PRN
  Administered 2011-10-18: 3 mg via INTRAVENOUS

## 2011-10-18 MED ORDER — KETOROLAC TROMETHAMINE 30 MG/ML IJ SOLN
30.0000 mg | Freq: Four times a day (QID) | INTRAMUSCULAR | Status: AC
  Administered 2011-10-18 – 2011-10-19 (×5): 30 mg via INTRAVENOUS
  Filled 2011-10-18 (×5): qty 1

## 2011-10-18 MED ORDER — ONDANSETRON HCL 4 MG/2ML IJ SOLN: 4.0000 mg | Freq: Once | INTRAMUSCULAR | Status: DC | PRN

## 2011-10-18 SURGICAL SUPPLY — 62 items
APL SKNCLS STERI-STRIP NONHPOA (GAUZE/BANDAGES/DRESSINGS) ×1
APPLIER CLIP 11 MED OPEN (CLIP)
APPLIER CLIP 13 LRG OPEN (CLIP)
APR CLP LRG 13 20 CLIP (CLIP)
APR CLP MED 11 20 MLT OPN (CLIP)
BAG HAMPER (MISCELLANEOUS) ×2 IMPLANT
BENZOIN TINCTURE PRP APPL 2/3 (GAUZE/BANDAGES/DRESSINGS) ×1 IMPLANT
BRR ADH 6X5 SEPRAFILM 1 SHT (MISCELLANEOUS)
CELLS DAT CNTRL 66122 CELL SVR (MISCELLANEOUS) IMPLANT
CLIP APPLIE 11 MED OPEN (CLIP) IMPLANT
CLIP APPLIE 13 LRG OPEN (CLIP) IMPLANT
CLOTH BEACON ORANGE TIMEOUT ST (SAFETY) ×2 IMPLANT
COVER LIGHT HANDLE STERIS (MISCELLANEOUS) ×4 IMPLANT
DRAPE WARM FLUID 44X44 (DRAPE) ×2 IMPLANT
DRESSING TELFA 8X3 (GAUZE/BANDAGES/DRESSINGS) ×4 IMPLANT
ELECT REM PT RETURN 9FT ADLT (ELECTROSURGICAL) ×2
ELECTRODE REM PT RTRN 9FT ADLT (ELECTROSURGICAL) ×1 IMPLANT
EVACUATOR DRAINAGE 10X20 100CC (DRAIN) IMPLANT
EVACUATOR SILICONE 100CC (DRAIN) ×2
FORMALIN 10 PREFIL 480ML (MISCELLANEOUS) ×1 IMPLANT
GLOVE ECLIPSE 6.5 STRL STRAW (GLOVE) ×2 IMPLANT
GLOVE ECLIPSE 9.0 STRL (GLOVE) ×2 IMPLANT
GLOVE EXAM NITRILE MD LF STRL (GLOVE) ×2 IMPLANT
GLOVE INDICATOR 7.0 STRL GRN (GLOVE) ×2 IMPLANT
GLOVE INDICATOR STER SZ 9 (GLOVE) ×2 IMPLANT
GOWN STRL REIN 3XL LVL4 (GOWN DISPOSABLE) ×2 IMPLANT
GOWN STRL REIN XL XLG (GOWN DISPOSABLE) ×4 IMPLANT
INST SET MAJOR GENERAL (KITS) ×2 IMPLANT
KIT ROOM TURNOVER APOR (KITS) ×2 IMPLANT
MANIFOLD NEPTUNE II (INSTRUMENTS) ×2 IMPLANT
NEEDLE HYPO 25X1 1.5 SAFETY (NEEDLE) ×2 IMPLANT
NS IRRIG 1000ML POUR BTL (IV SOLUTION) ×4 IMPLANT
PACK ABDOMINAL MAJOR (CUSTOM PROCEDURE TRAY) ×2 IMPLANT
RETRACTOR WND ALEXIS 18 MED (MISCELLANEOUS) IMPLANT
RETRACTOR WND ALEXIS 25 LRG (MISCELLANEOUS) ×1 IMPLANT
RTRCTR WOUND ALEXIS 18CM MED (MISCELLANEOUS)
RTRCTR WOUND ALEXIS 25CM LRG (MISCELLANEOUS) ×2
SEPRAFILM MEMBRANE 5X6 (MISCELLANEOUS) IMPLANT
SET BASIN LINEN APH (SET/KITS/TRAYS/PACK) ×2 IMPLANT
SOL PREP PROV IODINE SCRUB 4OZ (MISCELLANEOUS) ×2 IMPLANT
SPONGE DRAIN TRACH 4X4 STRL 2S (GAUZE/BANDAGES/DRESSINGS) ×1 IMPLANT
SPONGE GAUZE 4X4 12PLY (GAUZE/BANDAGES/DRESSINGS) ×1 IMPLANT
SPONGE LAP 18X18 X RAY DECT (DISPOSABLE) IMPLANT
STAPLER VISISTAT 35W (STAPLE) ×1 IMPLANT
STRIP CLOSURE SKIN 1/2X4 (GAUZE/BANDAGES/DRESSINGS) ×4 IMPLANT
SUT CHROMIC 0 CT 1 (SUTURE) ×18 IMPLANT
SUT CHROMIC 2 0 CT 1 (SUTURE) ×3 IMPLANT
SUT CHROMIC GUT BROWN 0 54 (SUTURE) IMPLANT
SUT CHROMIC GUT BROWN 0 54IN (SUTURE)
SUT ETHILON 3 0 FSL (SUTURE) ×2 IMPLANT
SUT PDS AB CT VIOLET #0 27IN (SUTURE) IMPLANT
SUT PLAIN CT 1/2CIR 2-0 27IN (SUTURE) ×4 IMPLANT
SUT PROLENE 0 CT 1 30 (SUTURE) IMPLANT
SUT VIC AB 0 CT1 27 (SUTURE) ×2
SUT VIC AB 0 CT1 27XBRD ANTBC (SUTURE) ×1 IMPLANT
SUT VIC AB 2-0 CT1 27 (SUTURE)
SUT VIC AB 2-0 CT1 TAPERPNT 27 (SUTURE) IMPLANT
SUT VICRYL 4 0 KS 27 (SUTURE) ×2 IMPLANT
SYR CONTROL 10ML LL (SYRINGE) ×1 IMPLANT
TAPE CLOTH SURG 4X10 WHT LF (GAUZE/BANDAGES/DRESSINGS) ×1 IMPLANT
TOWEL BLUE STERILE X RAY DET (MISCELLANEOUS) ×2 IMPLANT
TRAY FOLEY CATH 14FR (SET/KITS/TRAYS/PACK) ×2 IMPLANT

## 2011-10-18 NOTE — Op Note (Signed)
Please see the dictated operative note included in the Brief Op Note of this same date.

## 2011-10-18 NOTE — Addendum Note (Signed)
Addendum  created 10/18/11 1429 by Laurene Footman, MD   Modules edited:Orders, PRL Based Order Sets

## 2011-10-18 NOTE — Interval H&P Note (Signed)
History and Physical Interval Note:  10/18/2011 11:49 AM  Deborah Kennedy  has presented today for surgery, with the diagnosis of uterine fibroids pelvic pain abdominal pain right lower quadrant  The various methods of treatment have been discussed with the patient and family. After consideration of risks, benefits and other options for treatment, the patient has consented to  Procedure(s): HYSTERECTOMY ABDOMINAL as a surgical intervention .  The patients' history has been reviewed, patient examined, no change in status, stable for surgery.  I have reviewed the patients' chart and labs.  Questions were answered to the patient's satisfaction.     Deborah Kennedy  I have interviewed the patient, reviewed labs, and confirmed pt is on menses.  No change in patient condition and plans.

## 2011-10-18 NOTE — Anesthesia Postprocedure Evaluation (Addendum)
  Anesthesia Post-op Note  Patient: Production assistant, radio  Procedure(s) Performed:  HYSTERECTOMY ABDOMINAL  Patient Location: PACU  Anesthesia Type: General  Level of Consciousness: awake, alert , oriented and patient cooperative  Airway and Oxygen Therapy: Patient Spontanous Breathing  Post-op Pain: 3 /10  Post-op Assessment: Post-op Vital signs reviewed, Patient's Cardiovascular Status Stable, Respiratory Function Stable, Patent Airway, No signs of Nausea or vomiting and Pain level controlled  Post-op Vital Signs: Reviewed and stable  Complications: No apparent anesthesia complications 10/19/11  VSS, Patient denies recall, sore throat.  Reports some nausea yesterday, but no vomiting.  Pain controlled.  No apparent anesthesia complications.

## 2011-10-18 NOTE — Anesthesia Preprocedure Evaluation (Addendum)
Anesthesia Evaluation  Patient identified by MRN, date of birth, ID band Patient awake    Reviewed: Allergy & Precautions, H&P , NPO status , Patient's Chart, lab work & pertinent test results  Airway Mallampati: II TM Distance: >3 FB Neck ROM: Full    Dental  (+) Teeth Intact   Pulmonary neg pulmonary ROS,  clear to auscultation        Cardiovascular neg cardio ROS Regular Normal    Neuro/Psych    GI/Hepatic   Endo/Other    Renal/GU      Musculoskeletal   Abdominal   Peds  Hematology   Anesthesia Other Findings   Reproductive/Obstetrics                          Anesthesia Physical Anesthesia Plan  ASA: I  Anesthesia Plan: General   Post-op Pain Management:    Induction: Intravenous  Airway Management Planned: Oral ETT  Additional Equipment:   Intra-op Plan:   Post-operative Plan: Extubation in OR  Informed Consent: I have reviewed the patients History and Physical, chart, labs and discussed the procedure including the risks, benefits and alternatives for the proposed anesthesia with the patient or authorized representative who has indicated his/her understanding and acceptance.     Plan Discussed with:   Anesthesia Plan Comments:         Anesthesia Quick Evaluation

## 2011-10-18 NOTE — Transfer of Care (Signed)
Immediate Anesthesia Transfer of Care Note  Patient: Deborah Kennedy  Procedure(s) Performed:  HYSTERECTOMY ABDOMINAL  Patient Location: PACU  Anesthesia Type: General  Level of Consciousness: awake and patient cooperative  Airway & Oxygen Therapy: Patient Spontanous Breathing and Patient connected to face mask oxygen  Post-op Assessment: Report given to PACU RN, Post -op Vital signs reviewed and stable and Patient moving all extremities  Post vital signs: Reviewed and stable  Complications: No apparent anesthesia complications

## 2011-10-18 NOTE — Progress Notes (Signed)
Pt had  colon prep on 10/17/2011 with good results, menses in progress.

## 2011-10-18 NOTE — Anesthesia Procedure Notes (Signed)
Procedure Name: Intubation Date/Time: 10/18/2011 12:16 PM Performed by: Despina Hidden Pre-anesthesia Checklist: Patient identified, Patient being monitored, Timeout performed and Suction available Patient Re-evaluated:Patient Re-evaluated prior to inductionOxygen Delivery Method: Circle System Utilized Preoxygenation: Pre-oxygenation with 100% oxygen Intubation Type: IV induction Ventilation: Mask ventilation without difficulty Laryngoscope Size: Mac and 3 Grade View: Grade I Tube type: Oral Number of attempts: 1 Airway Equipment and Method: stylet Placement Confirmation: ETT inserted through vocal cords under direct vision,  breath sounds checked- equal and bilateral and positive ETCO2 Secured at: 22 cm Tube secured with: Tape Dental Injury: Teeth and Oropharynx as per pre-operative assessment

## 2011-10-18 NOTE — Brief Op Note (Signed)
10/18/2011  2:07 PM  PATIENT:  Deborah Kennedy  50 y.o. female  PRE-OPERATIVE DIAGNOSIS:  uterine fibroids, pelvic pain abdominal pain right lower quadrant  POST-OPERATIVE DIAGNOSIS:  uterine fibroids, pelvic pain abdominal pain right lower quadrant  PROCEDURE:  Procedure(s): HYSTERECTOMY ABDOMINAL  SURGEON:  Surgeon(s): Tilda Burrow, MD  PHYSICIAN ASSISTANT:   ASSISTANTSAnnabell Howells, RN-FA   ANESTHESIA:   local and general  EBL:  Total I/O In: 2000 [I.V.:2000] Out: 400 [Urine:150; Blood:250]  BLOOD ADMINISTERED:none  DRAINS: (10 mm) Jackson-Pratt drain(s) with closed bulb suction in the Subcutaneous space   LOCAL MEDICATIONS USED:  MARCAINE 20CC  SPECIMEN:  Source of Specimen:  uterus and cervix  DISPOSITION OF SPECIMEN:  PATHOLOGY  COUNTS:  YES  TOURNIQUET:  * No tourniquets in log *  DICTATION: .Dragon Dictation patient was taken to the operating room prepped and draped for lower abdominal surgery after prepping of the vagina Foley catheter placement and timeout conducted with all involved confirming the procedure as planned. Abdominal incision and the method of Pfannenstiel was performed along the lines marked preoperatively with excision of a 30 cm long by 10 cm wide ellipse of skin and underlying fatty tissue. The remaining subcutaneous fatty tissue was opened over the fascia and the fascia opened transversely. Midline was used to (and cavity with care and bowel elevated the uterus was identified. Alexis medium wound retractor was positioned and bowel packed away. Uterus could be rotated into the incision, and round ligaments ligated and transected. Bladder flap was developed anteriorly. Utero-ovarian ligament and broad ligament were isolated with cross clamping transection and suture ligating with 0 chromic. Uterine vessels were skeletonized. This was performed bilaterally. Uterine vessels were quite large, bilaterally, right greater size  than left. Right  uterine vessels were crossclamped with curved Heaney clamp with Kelly placed across the vessels for  backbleeding control, transected, and suture ligated with 0 chromic. Upper cardinal ligaments were then clamped , cut and suture ligated. Malleable metal retractor was placed behind the uterus over the laparotomy tapes to protect the bowel and then the uterus was amputated off the lower uterine segment for improved visibility. The remainder of the lower cardinal ligaments were taken down by clamping cutting and suture  ligation on both sides, with straight Heaney clamps, knife dissection and 0-chromic suture ligature. A #15 blade stab incision in the anterior cervicovaginal fornix was performed, then the cervix amputated off the cuff. There was generous bleeding from the cuff edge just behind the lower cardinal ligaments bilaterally. Coker clamps were used to grasp these areas. Aldridge stitches were placed at each lateral vaginal angle incorporating the lateral aspects of the uterosacral ligament and the lower cardinal ligaments into the cuff angle for support cuff edge was trimmed smoothly, then closed with a series of interrupted 0 chromic sutures sewn transversely with good hemostasis and tissue approximation and good bladder support. Pelvis was irrigated. A small area of bleeding on the right utero-ovarian pedicle required superficial ligation with 2-0 chromic. Remainder of the pelvic peritoneum was loosely reapproximated with interrupted 2-0 chromic. It was was irrigated again hemostasis was confirmed. Laparotomy equipment was removed including the Alexis wound retractor, and anterior peritoneum closed with running 2-0 chromic followed by running 0 Vicryl closure of the fascial layer. Subcutaneous tissues were loosely reapproximated with horizontal mattress sutures, dense the Jackson-Pratt 10 mm drain placed in the subcutaneous space, and the subcuticular 4-0 Vicryl closure of the skin incision performed  complete the procedure. Sponge and needle counts were  correct. Patient tolerated procedure well. Patient to recovery room in good condition EBL 350 cc  PLAN OF CARE: Admit to inpatient   PATIENT DISPOSITION:  PACU - hemodynamically stable.   Delay start of Pharmacological VTE agent (>24hrs) due to surgical blood loss or risk of bleeding:  {YES/NO/NOT APPLICABLE:20182

## 2011-10-18 NOTE — Addendum Note (Signed)
Addendum  created 10/18/11 1419 by Justise Ehmann J Vinie Charity   Modules edited:Anesthesia Medication Administration    

## 2011-10-18 NOTE — Addendum Note (Signed)
Addendum  created 10/18/11 1419 by Despina Hidden   Modules edited:Anesthesia Medication Administration

## 2011-10-19 LAB — CBC
HCT: 32 % — ABNORMAL LOW (ref 36.0–46.0)
Hemoglobin: 10.8 g/dL — ABNORMAL LOW (ref 12.0–15.0)
MCHC: 33.8 g/dL (ref 30.0–36.0)
RBC: 3.69 MIL/uL — ABNORMAL LOW (ref 3.87–5.11)
WBC: 6.9 10*3/uL (ref 4.0–10.5)

## 2011-10-19 LAB — BASIC METABOLIC PANEL
Chloride: 105 mEq/L (ref 96–112)
GFR calc non Af Amer: >90 mL/min (ref >90)
Glucose, Bld: 121 mg/dL — ABNORMAL HIGH (ref 70–99)
Potassium: 3.6 mEq/L (ref 3.5–5.1)
Sodium: 136 mEq/L (ref 135–145)

## 2011-10-19 MED ORDER — SODIUM CHLORIDE 0.9 % IJ SOLN
INTRAMUSCULAR | Status: AC
  Filled 2011-10-19: qty 3

## 2011-10-19 NOTE — Progress Notes (Signed)
UR Chart Review Completed  

## 2011-10-19 NOTE — Progress Notes (Signed)
Removed patients foley this AM without difficulty. Instructed patient that we would need her to urinate within the next 6-8 hours and to please call for assistance walking to the bathroom. Also reminded patient to cough, turn, deep breathe, and use her IS. Reminded patient to splint while doing so. Explained use of JP drain to patient and how to charge JP. Patient verbalized understanding. Patient questioned use of SCDs. Explained the importance of preventing blood clots and that progressive movement is important as well. Patient stated "well just leave them on then". Also gave patient handout on hysterectomy. Patient acknowledged understanding.

## 2011-10-19 NOTE — Addendum Note (Signed)
Addendum  created 10/19/11 0731 by Glynn Octave   Modules edited:Notes Section

## 2011-10-19 NOTE — Progress Notes (Signed)
1 Day Post-Op Procedure(s): HYSTERECTOMY ABDOMINAL  Subjective: Patient reports incisional pain, tolerating PO and no problems voiding. Foley d/c'd this a.m. As per protocol , pt not voided yet . Pt expresses dissatisfaction with time required to get SCD's back on last nite, and sound control and length of alert alarm ringing on IV.  Nursing aware of pt concerns. Pt using PCA minimally. She has family with her today.  Objective: I have reviewed patient's vital signs, intake and output, medications and labs. She looks great.   General: alert, cooperative and no distress GI: normal findings: abd soft, no distention, abnormal findings:   and incision: clean, dry and minimal blood on dressing, marked last nite with no expansion since markking. JP c scanty serosanguinous fluid.  Assessment: s/p Procedure(s): HYSTERECTOMY ABDOMINAL: stable and anemia  Plan: Advance diet Encourage ambulation Advance to PO medication  LOS: 1 day    Deborah Kennedy V 10/19/2011, 9:21 AM

## 2011-10-19 NOTE — Progress Notes (Signed)
Instructed pt on importance of cough, turn, deep breath and splinting. Demonstrated to pt. Pt stated understanding.

## 2011-10-20 MED ORDER — ONDANSETRON HCL 4 MG/2ML IJ SOLN
4.0000 mg | Freq: Four times a day (QID) | INTRAMUSCULAR | Status: DC | PRN
  Administered 2011-10-20: 4 mg via INTRAVENOUS
  Filled 2011-10-20: qty 2

## 2011-10-20 MED ORDER — METOCLOPRAMIDE HCL 10 MG/10ML PO SOLN
10.0000 mg | Freq: Three times a day (TID) | ORAL | Status: DC
  Administered 2011-10-20 – 2011-10-21 (×4): 10 mg via ORAL
  Filled 2011-10-20 (×14): qty 10

## 2011-10-20 NOTE — Progress Notes (Signed)
2 Days Post-Op Procedure(s): HYSTERECTOMY ABDOMINAL  Subjective: Patient reports lightheadedness when upright.  Pt concerned whether she should be trying to be up, given her dizziness.  Pt more sad, quiet than yesterday.  Denies vomiting,  Denies flatus.  Did notice some burping. No bm. Notes nausea.   Objective: I have reviewed patient's vital signs, intake and output and pathology.  Chest clear,  Abd soft no distention, bowel sounds hypoactive. No tympani. Legs: nontender.   Assessment: s/p Procedure(s): HYSTERECTOMY ABDOMINAL: stable and slow recovery.  Adjustment rxn vs ?  Plan: keep another day. continue iv.  convert phenergan to Zofran.  LOS: 2 days    Deborah Kennedy V 10/20/2011, 9:58 AM

## 2011-10-21 LAB — CBC
HCT: 30.7 % — ABNORMAL LOW (ref 36.0–46.0)
Hemoglobin: 10.3 g/dL — ABNORMAL LOW (ref 12.0–15.0)
MCH: 29.3 pg (ref 26.0–34.0)
RBC: 3.52 MIL/uL — ABNORMAL LOW (ref 3.87–5.11)

## 2011-10-21 LAB — BASIC METABOLIC PANEL
BUN: 5 mg/dL — ABNORMAL LOW (ref 6–23)
CO2: 26 mEq/L (ref 19–32)
Calcium: 9.3 mg/dL (ref 8.4–10.5)
Glucose, Bld: 103 mg/dL — ABNORMAL HIGH (ref 70–99)
Potassium: 3.6 mEq/L (ref 3.5–5.1)
Sodium: 134 mEq/L — ABNORMAL LOW (ref 135–145)

## 2011-10-21 MED ORDER — TRAMADOL HCL 50 MG PO TABS: 50.0000 mg | ORAL_TABLET | Freq: Four times a day (QID) | ORAL | Status: AC | PRN

## 2011-10-21 NOTE — Progress Notes (Signed)
3 Days Post-Op Procedure(s): HYSTERECTOMY ABDOMINAL  Subjective: Patient reports tolerating PO, + flatus, + BM and no problems voiding.    Objective: I have reviewed patient's vital signs, intake and output, medications and pathology.  General: alert, cooperative and smiling, moving out of bed, the first time that I have seen her OOB GI: soft, non-tender; bowel sounds normal; no masses,  no organomegaly and incision: clean, dry, intact and drain removed. Extremities: extremities normal, atraumatic, no cyanosis or edema and Homans sign is negative, no sign of DVT Drain output 15 cc/24 hours  Assessment: s/p Procedure(s): HYSTERECTOMY ABDOMINAL: stable, progressing well and tolerating diet  Plan: Discharge home  LOS: 3 days    Quandre Polinski V 10/21/2011, 8:37 AM

## 2011-10-21 NOTE — Progress Notes (Addendum)
Pt discharged with instructions and prescriptions.  She verbalized understanding.  She left the floor via w/c with staff in stable condition.  Dr. Emelda Fear removed the patients JP drain prior to d/c.  Pt was offered pain med and she declined.   Heat pack was given to patient to take home, I offered to place here but she said she would place when she got home.

## 2011-10-21 NOTE — Discharge Summary (Signed)
Physician Discharge Summary  Patient ID: Deborah Kennedy MRN: [id number] DOB/AGE: 1961/03/12 50 y.o.  Admit date: 10/18/2011 Discharge date: 10/21/2011  Admission Diagnoses:Uterine Fibroids   Discharge Diagnoses: Uterine fibroids  Active Problems:  * No active hospital problems. *    Discharged Condition: good  Hospital Course: Deborah Kennedy was admitted through day surgery undergoing hysterectomy with excision of redundant skin and subcutaneous tissues along the 30 cm incision. Procedure was uncomplicated with 350 cc blood loss. Uterine fibroids were confirmed on final path report. Postoperatively the patient did well surgically, but had some minor complaints related to Flowtron's on her legs and Ambient noise in the hallway. On postoperative day 2 she was feeling nauseated and reluctant for home care so a third day was necessary. On postoperative day 3 she looked in excellent condition. The IV had infiltrated in the back of her left hand with no sequelae considered not clinically significant. Incision looked excellent Jackson-Pratt drain was removed. Was discharged home with routine postsurgical instructions for followup January 7 [facility] or earlier when necessary problems  Consults: none  Significant Diagnostic Studies: labs:  CBC    Component Value Date/Time   WBC 4.5 10/21/2011 0651   RBC 3.52* 10/21/2011 0651   HGB 10.3* 10/21/2011 0651   HCT 30.7* 10/21/2011 0651   PLT 183 10/21/2011 0651   MCV 87.2 10/21/2011 0651   MCH 29.3 10/21/2011 0651   MCHC 33.6 10/21/2011 0651   RDW 12.8 10/21/2011 0651   LYMPHSABS 2.1 10/31/2009 1842   MONOABS 0.4 10/31/2009 1842   EOSABS 0.1 10/31/2009 1842   BASOSABS 0.0 10/31/2009 1842    and pathology  500  + gram uterus with fibroids  Treatments: surgery: Abdominal hysterectomy through transverse lower abdominal incision, with removal of cervix   Discharge Exam: Blood pressure 125/82, pulse 71, temperature 98.3 F (36.8  C), temperature source Oral, resp. rate 16, last menstrual period 10/15/2011, SpO2 97.00%. General appearance: alert, cooperative and no distress GI: normal findings: bowel sounds normal and clean incision  Disposition:   Discharge Orders    Future Orders Please Complete By Expires   Diet - low sodium heart healthy      Increase activity slowly      Discharge instructions      Comments:   General Gynecological Post-Operative Instructions You may expect to feel dizzy, weak, and drowsy for as long as 24 hours after receiving the medicine that made you sleep (anesthetic). The following information pertains to your recovery period for the first 24 hours following surgery.  Do not drive a car, ride a bicycle, participate in physical activities, or take public transportation until you are done taking narcotic pain medicines or as directed by your caregiver.  Do not drink alcohol or take tranquilizers.  Do not take medicine that has not been prescribed by your caregiver.  Do not sign important papers or make important decisions while on narcotic pain medicines.  Have a responsible person with you.  CARE OF INCISION  Keep incision clean and dry. Take showers instead of baths until your caregiver gives you permission to take baths. Check with your caregiver if you have tubes coming from the wound site.  Avoid heavy lifting (more than 10 pounds/4.5 kilograms), pushing, or pulling.  Avoid activities that may risk injury to your surgical site.  Only take over-the-counter or prescription medicines for pain, discomfort, or fever as directed by your caregiver. Do not take aspirin. It can make you bleed. Take medicines (antibiotics) that  kill germs as directed.  Call the office or go to the MAU if:  You feel sick to your stomach (nauseous).  You start to throw up (vomit).  You have trouble eating or drinking.  You have an oral temperature above 100.4.  You have constipation that is not helped by  adjusting diet or increasing fluid intake. Pain medicines are a common cause of constipation.  SEEK IMMEDIATE MEDICAL CARE IF:  You have persistent dizziness.  You have difficulty breathing or a congested sounding (croupy) cough.  You have an oral temperature above 102.5, not controlled by medicine.  There is increasing pain or tenderness near or in the surgical site.  ExitCare Patient Information 2011 [place], [place].   Sexual Activity Restrictions      Comments:   No sexual relations til reevaluated in office 6 weeks   Driving Restrictions      Comments:   No driving in [place] x 1 week. No driving in [place] x 2 weeks.   Discharge wound care:      Comments:   May shower and blot dry     Medication List  As of 10/21/2011  8:47 AM   START taking these medications         traMADol 50 MG tablet   Commonly known as: ULTRAM   Take 1 tablet (50 mg total) by mouth every 6 (six) hours as needed for pain. Maximum dose= 8 tablets per day         CONTINUE taking these medications         acetaminophen 500 MG tablet   Commonly known as: TYLENOL      aspirin 81 MG chewable tablet      ibuprofen 200 MG tablet   Commonly known as: ADVIL,MOTRIN          Where to get your medications    These are the prescriptions that you need to pick up.   You may get these medications from any pharmacy.         traMADol 50 MG tablet           Follow-up Information    Follow up with Tilda Burrow, MD on 11/05/2011. (or earlier as needed if symptoms worsen)    Contact information:   [facility] [address] [place] [address] [phone]          Signed: Tilda Burrow 10/21/2011, 8:47 AM

## 2011-10-26 ENCOUNTER — Encounter (HOSPITAL_COMMUNITY): Payer: Self-pay | Admitting: Obstetrics and Gynecology

## 2013-05-26 ENCOUNTER — Other Ambulatory Visit (HOSPITAL_COMMUNITY): Payer: Self-pay | Admitting: Family Medicine

## 2013-05-26 DIAGNOSIS — R519 Headache, unspecified: Secondary | ICD-10-CM

## 2013-05-28 ENCOUNTER — Encounter (HOSPITAL_COMMUNITY): Payer: Self-pay

## 2013-05-28 ENCOUNTER — Ambulatory Visit (HOSPITAL_COMMUNITY)
Admission: RE | Admit: 2013-05-28 | Discharge: 2013-05-28 | Disposition: A | Discharge status: Home / Self Care | Payer: 59 | Source: Ambulatory Visit | Attending: Family Medicine | Admitting: Family Medicine

## 2013-05-28 DIAGNOSIS — R519 Headache, unspecified: Secondary | ICD-10-CM

## 2013-05-28 DIAGNOSIS — R51 Headache: Secondary | ICD-10-CM | POA: Insufficient documentation

## 2014-02-09 ENCOUNTER — Ambulatory Visit (INDEPENDENT_AMBULATORY_CARE_PROVIDER_SITE_OTHER): Payer: 59 | Admitting: Orthopedic Surgery

## 2014-02-09 ENCOUNTER — Ambulatory Visit: Payer: 59

## 2014-02-09 ENCOUNTER — Ambulatory Visit (INDEPENDENT_AMBULATORY_CARE_PROVIDER_SITE_OTHER): Payer: 59

## 2014-02-09 DIAGNOSIS — M25569 Pain in unspecified knee: Secondary | ICD-10-CM

## 2014-02-09 NOTE — Progress Notes (Signed)
Patient ID: Deborah Kennedy, female   DOB: 04-Aug-1961, 53 y.o.   MRN: [id number] Right knee pain  Date of injury April 13 slipped on a wet floor.a pop lateral side right leg painful gait  X-ray normal

## 2015-01-20 ENCOUNTER — Ambulatory Visit (INDEPENDENT_AMBULATORY_CARE_PROVIDER_SITE_OTHER): Payer: 59 | Admitting: Otolaryngology

## 2015-02-10 ENCOUNTER — Ambulatory Visit (INDEPENDENT_AMBULATORY_CARE_PROVIDER_SITE_OTHER): Payer: 59 | Admitting: Otolaryngology

## 2015-02-10 DIAGNOSIS — S01302A Unspecified open wound of left ear, initial encounter: Secondary | ICD-10-CM | POA: Diagnosis not present

## 2015-08-30 ENCOUNTER — Ambulatory Visit (HOSPITAL_COMMUNITY)
Admission: RE | Admit: 2015-08-30 | Discharge: 2015-08-30 | Disposition: A | Discharge status: Home / Self Care | Payer: 59 | Attending: Psychiatry | Admitting: Psychiatry

## 2015-08-30 ENCOUNTER — Encounter (HOSPITAL_COMMUNITY): Payer: Self-pay | Admitting: *Deleted

## 2015-08-30 DIAGNOSIS — F419 Anxiety disorder, unspecified: Secondary | ICD-10-CM | POA: Insufficient documentation

## 2015-08-30 DIAGNOSIS — F430 Acute stress reaction: Secondary | ICD-10-CM | POA: Diagnosis not present

## 2015-08-30 DIAGNOSIS — Z630 Problems in relationship with spouse or partner: Secondary | ICD-10-CM | POA: Insufficient documentation

## 2015-08-30 DIAGNOSIS — F329 Major depressive disorder, single episode, unspecified: Secondary | ICD-10-CM | POA: Diagnosis present

## 2015-08-30 HISTORY — DX: Anxiety disorder, unspecified: F41.9

## 2015-08-30 HISTORY — DX: Major depressive disorder, single episode, unspecified: F32.9

## 2015-08-30 HISTORY — DX: Depression, unspecified: F32.A

## 2015-08-30 NOTE — BH Assessment (Signed)
Tele Assessment Note   Deborah Kennedy is a 54 y.o. female who presents as a walk in to [facility], accompanied by her husband.  Pt c/o worsening depressive sxs since Aug 2016 and SI thoughts, stating that she had SI thoughts earlier and a plan to overdose on pills, however she no longer endorses the thoughts or plan and contracted for safety with this Probation officer.  Pt reports the following: pt says her spouse revealed to her that he has been having an affair with another woman in his office for 3-4 years.  She says that she had forgiven him for the indiscretion, however he told her about other issues and pt says she "couldn't take it anymore".  Pt says she has been married for 24 yrs and pt.'s husband told her that he has a 88 yr old child that he never told her about and he has been communicating with his "office affair" daily with a phone that he purchased for the other woman and pt asked for the phone records, when her husband provided the records, she says became angry.  Pt says the couple has been going to marriage counseling([facility]) for 3 weeks and pt states there are other issues that her husband has talked about in therapy that she didn't want to disclose to this Probation officer. Pt denies HI/SA/AVH, but admits 1 previous SI attempt at 54 yrs old by overdose and cutting her wrists.  Pt describes her depression to this Probation officer as feeling that her life has been a lie because of her husband's cheating.  She has daily crying spells, and says she has only 30 mins in the last 3 days.  This Probation officer discussed disposition with Patriciaann Clan, PA who recommends d/c with safety contract and referrals for individual therapy.   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 Complication of anesthesia     reaction to epidural medication  . Depression   . Anxiety     Past Surgical History  Procedure Laterality Date  . Tubal  ligation    . Cervical fusion  2008  . Abdominal hysterectomy  10/18/2011    Procedure: HYSTERECTOMY ABDOMINAL;  Surgeon: Jonnie Kind, MD;  Location: [facility];  Service: Gynecology;  Laterality: N/A;    Family History: No family history on file.  Social History:  reports that she has never smoked. She does not have any smokeless tobacco history on file. She reports that she does not drink alcohol or use illicit drugs.  Additional Social History:  Alcohol / Drug Use Pain Medications: See MAR  Prescriptions: See MAR  Over the Counter: See MAR  History of alcohol / drug use?: No history of alcohol / drug abuse Longest period of sobriety (when/how long): None   CIWA:   COWS:    PATIENT STRENGTHS: (choose at least two) Average or above average intelligence Communication skills Motivation for treatment/growth Religious Affiliation Supportive family/friends  Allergies: No Known Allergies  Home Medications:  (Not in a hospital admission)  OB/GYN Status:  Patient's last menstrual period was 09/19/2011.  General Assessment Data Location of Assessment: 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  Is patient pregnant?: No Pregnancy Status: No Living Arrangements: Spouse/significant other, Children Can pt return to current living arrangement?: Yes  Admission Status: Voluntary Is patient capable of signing voluntary admission?: Yes Referral Source: MD Insurance type: [employer]   Medical Screening Exam ([place]) Medical Exam completed: No Reason for MSE not completed: Patient Refused (Signed Form )  Crisis Care Plan Living Arrangements: Spouse/significant other, Children Name of Psychiatrist: None  Name of Therapist: Ruta Hinds counselor  Education Status Is  patient currently in school?: No Current Grade: None  Highest grade of school patient has completed: None  Name of school: None  Contact person: None  Risk to self with the past 6 months Suicidal Ideation: No-Not Currently/Within Last 6 Months Has patient been a risk to self within the past 6 months prior to admission? : Yes Suicidal Intent: No-Not Currently/Within Last 6 Months Has patient had any suicidal intent within the past 6 months prior to admission? : Yes Is patient at risk for suicide?: No Suicidal Plan?: No-Not Currently/Within Last 6 Months Has patient had any suicidal plan within the past 6 months prior to admission? : Yes Access to Means: Yes Specify Access to Suicidal Means: Pills, Sharps  What has been your use of drugs/alcohol within the last 12 months?: Pt denies  Previous Attempts/Gestures: Yes How many times?: 1 Other Self Harm Risks: None  Triggers for Past Attempts: Other personal contacts, Unpredictable Intentional Self Injurious Behavior: None Family Suicide History: No Recent stressful life event(s): Other (Comment) (Pls See EPIC Note ) Persecutory voices/beliefs?: No Depression: Yes Depression Symptoms: Insomnia, Tearfulness, Despondent, Loss of interest in usual pleasures, Feeling worthless/self pity, Feeling angry/irritable Substance abuse history and/or treatment for substance abuse?: No Suicide prevention information given to non-admitted patients: Not applicable  Risk to Others within the past 6 months Homicidal Ideation: No Does patient have any lifetime risk of violence toward others beyond the six months prior to admission? : No Thoughts of Harm to Others: No Current Homicidal Intent: No Current Homicidal Plan: No Access to Homicidal Means: No Identified Victim: None  History of harm to others?: No Assessment of Violence: None Noted Violent Behavior Description: None  Does patient have access to weapons?: No Criminal Charges Pending?:  No Does patient have a court date: No Is patient on probation?: No  Psychosis Hallucinations: None noted Delusions: None noted  Mental Status Report Appearance/Hygiene: Other (Comment) (Appropriate ) Eye Contact: Good Motor Activity: Unremarkable Speech: Logical/coherent, Soft Level of Consciousness: Alert Mood: Depressed, Sad Affect: Depressed, Sad, Flat Anxiety Level: Minimal Thought Processes: Coherent, Relevant Judgement: Partial Orientation: Person, Place, Time, Situation Obsessive Compulsive Thoughts/Behaviors: None  Cognitive Functioning Concentration: Normal Memory: Recent Intact, Remote Intact IQ: Average Insight: Fair Impulse Control: Good Appetite: Fair Weight Loss: 0 Weight Gain: 0 Sleep: Decreased Total Hours of Sleep:  (No sleep x3 days ) Vegetative Symptoms: None  ADLScreening [facility] Assessment Services) Patient's cognitive ability adequate to safely complete daily activities?: Yes Patient able to express need for assistance with ADLs?: Yes Independently performs ADLs?: Yes (appropriate for developmental age)  Prior Inpatient Therapy Prior Inpatient Therapy: Yes Prior Therapy Dates: [id number] Prior Therapy Facilty/Provider(s): [facility]; [place]  Reason for Treatment: SI/Depression   Prior Outpatient Therapy Prior Outpatient Therapy: Yes Prior Therapy Dates: Current  Prior Therapy Facilty/Provider(s): [facility] Counselor  Reason for Treatment: Therapy  Does patient have an ACCT team?: No Does patient have Intensive In-House Services?  : No Does patient have [place] services? : No Does patient have P4CC services?: No  ADL Screening (condition at time of admission) Patient's cognitive ability adequate to safely  complete daily activities?: Yes Is the patient deaf or have difficulty hearing?: No Does the patient have difficulty seeing, even when wearing glasses/contacts?: No Does the patient have difficulty  concentrating, remembering, or making decisions?: Yes Patient able to express need for assistance with ADLs?: Yes Does the patient have difficulty dressing or bathing?: No Independently performs ADLs?: Yes (appropriate for developmental age) Does the patient have difficulty walking or climbing stairs?: No Weakness of Legs: None Weakness of Arms/Hands: None  Home Assistive Devices/Equipment Home Assistive Devices/Equipment: None  Therapy Consults (therapy consults require a physician order) PT Evaluation Needed: No OT Evalulation Needed: No SLP Evaluation Needed: No Abuse/Neglect Assessment (Assessment to be complete while patient is alone) Physical Abuse: Denies Verbal Abuse: Denies Sexual Abuse: Denies Exploitation of patient/patient's resources: Denies Self-Neglect: Denies Values / Beliefs Cultural Requests During Hospitalization: None Spiritual Requests During Hospitalization: None Consults Spiritual Care Consult Needed: No Social Work Consult Needed: No Regulatory affairs officer (For Healthcare) Does patient have an advance directive?: No Would patient like information on creating an advanced directive?: No - patient declined information    Additional Information 1:1 In Past 12 Months?: No CIRT Risk: No Elopement Risk: No Does patient have medical clearance?: No     Disposition:  Disposition Initial Assessment Completed for this Encounter: Yes Disposition of Patient: Referred to (Per Patriciaann Clan, PA d/c w/referrals, safety contract ) Patient referred to: Other (Comment) (Per Patriciaann Clan, PA d/c w/referrals, safety contract )  Girtha Rm 08/30/2015 9:01 PM

## 2015-11-28 DIAGNOSIS — F4321 Adjustment disorder with depressed mood: Secondary | ICD-10-CM | POA: Diagnosis not present

## 2016-02-28 DIAGNOSIS — I10 Essential (primary) hypertension: Secondary | ICD-10-CM | POA: Diagnosis not present

## 2016-02-28 DIAGNOSIS — F413 Other mixed anxiety disorders: Secondary | ICD-10-CM | POA: Diagnosis not present

## 2016-06-19 DIAGNOSIS — K219 Gastro-esophageal reflux disease without esophagitis: Secondary | ICD-10-CM | POA: Diagnosis not present

## 2016-07-04 DIAGNOSIS — F329 Major depressive disorder, single episode, unspecified: Secondary | ICD-10-CM | POA: Diagnosis not present

## 2016-07-04 DIAGNOSIS — I10 Essential (primary) hypertension: Secondary | ICD-10-CM | POA: Diagnosis not present

## 2016-07-09 DIAGNOSIS — F4321 Adjustment disorder with depressed mood: Secondary | ICD-10-CM | POA: Diagnosis not present

## 2016-07-23 DIAGNOSIS — F4321 Adjustment disorder with depressed mood: Secondary | ICD-10-CM | POA: Diagnosis not present

## 2016-08-16 DIAGNOSIS — F4321 Adjustment disorder with depressed mood: Secondary | ICD-10-CM | POA: Diagnosis not present

## 2016-09-06 DIAGNOSIS — E662 Morbid (severe) obesity with alveolar hypoventilation: Secondary | ICD-10-CM | POA: Diagnosis not present

## 2016-09-06 DIAGNOSIS — Z1211 Encounter for screening for malignant neoplasm of colon: Secondary | ICD-10-CM | POA: Diagnosis not present

## 2016-09-27 DIAGNOSIS — F4321 Adjustment disorder with depressed mood: Secondary | ICD-10-CM | POA: Diagnosis not present

## 2016-10-01 DIAGNOSIS — Z1211 Encounter for screening for malignant neoplasm of colon: Secondary | ICD-10-CM | POA: Diagnosis not present

## 2016-10-01 DIAGNOSIS — D125 Benign neoplasm of sigmoid colon: Secondary | ICD-10-CM | POA: Diagnosis not present

## 2016-10-01 DIAGNOSIS — K635 Polyp of colon: Secondary | ICD-10-CM | POA: Diagnosis not present

## 2016-10-01 DIAGNOSIS — D128 Benign neoplasm of rectum: Secondary | ICD-10-CM | POA: Diagnosis not present

## 2016-10-01 DIAGNOSIS — K621 Rectal polyp: Secondary | ICD-10-CM | POA: Diagnosis not present

## 2016-10-01 DIAGNOSIS — D122 Benign neoplasm of ascending colon: Secondary | ICD-10-CM | POA: Diagnosis not present

## 2016-10-11 DIAGNOSIS — F4321 Adjustment disorder with depressed mood: Secondary | ICD-10-CM | POA: Diagnosis not present

## 2017-02-11 ENCOUNTER — Other Ambulatory Visit: Payer: Self-pay | Admitting: Family Medicine

## 2017-02-11 DIAGNOSIS — Z1231 Encounter for screening mammogram for malignant neoplasm of breast: Secondary | ICD-10-CM

## 2017-03-01 ENCOUNTER — Encounter: Payer: Self-pay | Admitting: Obstetrics and Gynecology

## 2017-03-06 ENCOUNTER — Ambulatory Visit
Admission: RE | Admit: 2017-03-06 | Discharge: 2017-03-06 | Disposition: A | Discharge status: Home / Self Care | Payer: 59 | Source: Ambulatory Visit | Attending: Family Medicine | Admitting: Family Medicine

## 2017-03-06 DIAGNOSIS — Z1231 Encounter for screening mammogram for malignant neoplasm of breast: Secondary | ICD-10-CM

## 2017-03-14 DIAGNOSIS — R351 Nocturia: Secondary | ICD-10-CM | POA: Diagnosis not present

## 2017-03-14 DIAGNOSIS — N3941 Urge incontinence: Secondary | ICD-10-CM | POA: Diagnosis not present

## 2017-03-14 DIAGNOSIS — R8271 Bacteriuria: Secondary | ICD-10-CM | POA: Diagnosis not present

## 2017-03-19 ENCOUNTER — Other Ambulatory Visit (HOSPITAL_COMMUNITY): Payer: Self-pay | Admitting: Orthopedic Surgery

## 2017-03-19 DIAGNOSIS — S83241A Other tear of medial meniscus, current injury, right knee, initial encounter: Secondary | ICD-10-CM | POA: Diagnosis not present

## 2017-03-19 DIAGNOSIS — M25561 Pain in right knee: Secondary | ICD-10-CM

## 2017-03-19 DIAGNOSIS — M25551 Pain in right hip: Secondary | ICD-10-CM | POA: Diagnosis not present

## 2017-03-19 DIAGNOSIS — M545 Low back pain: Secondary | ICD-10-CM | POA: Diagnosis not present

## 2017-03-26 ENCOUNTER — Encounter: Payer: Self-pay | Admitting: Obstetrics and Gynecology

## 2017-03-26 ENCOUNTER — Other Ambulatory Visit (INDEPENDENT_AMBULATORY_CARE_PROVIDER_SITE_OTHER): Payer: 59

## 2017-03-26 ENCOUNTER — Telehealth: Payer: Self-pay | Admitting: Obstetrics and Gynecology

## 2017-03-26 ENCOUNTER — Ambulatory Visit (INDEPENDENT_AMBULATORY_CARE_PROVIDER_SITE_OTHER): Payer: 59 | Admitting: Obstetrics and Gynecology

## 2017-03-26 ENCOUNTER — Other Ambulatory Visit: Payer: Self-pay | Admitting: Obstetrics and Gynecology

## 2017-03-26 VITALS — BP 140/90 | HR 78 | Ht 64.0 in | Wt 237.0 lb

## 2017-03-26 DIAGNOSIS — R1032 Left lower quadrant pain: Secondary | ICD-10-CM

## 2017-03-26 DIAGNOSIS — L739 Follicular disorder, unspecified: Secondary | ICD-10-CM

## 2017-03-26 MED ORDER — DOXYCYCLINE HYCLATE 100 MG PO CAPS: 100.0000 mg | ORAL_CAPSULE | Freq: Two times a day (BID) | ORAL | 0 refills | Status: DC

## 2017-03-26 NOTE — Patient Instructions (Signed)
Folliculitis Folliculitis is inflammation of the hair follicles. Folliculitis most commonly occurs on the scalp, thighs, legs, back, and buttocks. However, it can occur anywhere on the body. What are the causes? This condition may be caused by:  A bacterial infection (common).  A fungal infection.  A viral infection.  Coming into contact with certain chemicals, especially oils and tars.  Shaving or waxing.  Applying greasy ointments or creams to your skin often. Long-lasting folliculitis and folliculitis that keeps coming back can be caused by bacteria that live in the nostrils. What increases the risk? This condition is more likely to develop in people with:  A weakened immune system.  Diabetes.  Obesity. What are the signs or symptoms? Symptoms of this condition include:  Redness.  Soreness.  Swelling.  Itching.  Small white or yellow, pus-filled, itchy spots (pustules) that appear over a reddened area. If there is an infection that goes deep into the follicle, these may develop into a boil (furuncle).  A group of closely packed boils (carbuncle). These tend to form in hairy, sweaty areas of the body. How is this diagnosed? This condition is diagnosed with a skin exam. To find what is causing the condition, your health care provider may take a sample of one of the pustules or boils for testing. How is this treated? This condition may be treated by:  Applying warm compresses to the affected areas.  Taking an antibiotic medicine or applying an antibiotic medicine to the skin.  Applying or bathing with an antiseptic solution.  Taking an over-the-counter medicine to help with itching.  Having a procedure to drain any pustules or boils. This may be done if a pustule or boil contains a lot of pus or fluid.  Laser hair removal. This may be done to treat long-lasting folliculitis. Follow these instructions at home:  If directed, apply heat to the affected area as  often as told by your health care provider. Use the heat source that your health care provider recommends, such as a moist heat pack or a heating pad.  Place a towel between your skin and the heat source.  Leave the heat on for 20-30 minutes.  Remove the heat if your skin turns bright red. This is especially important if you are unable to feel pain, heat, or cold. You may have a greater risk of getting burned.  If you were prescribed an antibiotic medicine, use it as told by your health care provider. Do not stop using the antibiotic even if you start to feel better.  Take over-the-counter and prescription medicines only as told by your health care provider.  Do not shave irritated skin.  Keep all follow-up visits as told by your health care provider. This is important. Get help right away if:  You have more redness, swelling, or pain in the affected area.  Red streaks are spreading from the affected area.  You have a fever. This information is not intended to replace advice given to you by your health care provider. Make sure you discuss any questions you have with your health care provider. Document Released: 12/24/2001 Document Revised: 05/04/2016 Document Reviewed: 08/05/2015 Elsevier Interactive Patient Education  2017 Elsevier Inc.  

## 2017-03-26 NOTE — Progress Notes (Signed)
PELVIC US TV: normal vaginal cuff,normal ovaries bilat,no free fluid,ovaries appear mobile

## 2017-03-26 NOTE — Telephone Encounter (Signed)
Doxycycline called in to [facility].

## 2017-03-26 NOTE — Progress Notes (Signed)
   [facility] Clinic Visit  @DATE @            Patient name: Deborah Kennedy MRN [id number]  Date of birth: 16-May-1961  CC & HPI:  Deborah Kennedy is a 56 y.o. female presenting today for Concerns of left inguinal discomfort 1 month, a recently noted not on her left gluteal cheeks it is tender for at least a week as well as having had an episode of vaginal bleeding that was self-limited 1 day approximately 2 weeks ago.  ROS:  ROS denies fever chills Being treated for urge incontinence with pessary care with apparent satisfactory response  Pertinent History Reviewed:   Reviewed: Significant for Abdominal hysterectomy and wide excision of cicatrix performed several years ago ovaries preserved Medical         Past Medical History:  Diagnosis Date  . Anxiety   . Complication of anesthesia    reaction to epidural medication  . Depression                               Surgical Hx:    Past Surgical History:  Procedure Laterality Date  . ABDOMINAL HYSTERECTOMY  10/18/2011   Procedure: HYSTERECTOMY ABDOMINAL;  Surgeon: Jonnie Kind, MD;  Location: [facility];  Service: Gynecology;  Laterality: N/A;  . CERVICAL FUSION  2008  . TUBAL LIGATION     Medications: Reviewed & Updated - see associated section                       Current Outpatient Prescriptions:  .  acetaminophen (TYLENOL) 500 MG tablet, Take 1,000 mg by mouth every 6 (six) hours as needed. Aches and pains , Disp: , Rfl:  .  aspirin 81 MG chewable tablet, Chew 81 mg by mouth daily.  , Disp: , Rfl:  .  ibuprofen (ADVIL,MOTRIN) 200 MG tablet, Take 400 mg by mouth every 8 (eight) hours as needed. Aches and pain , Disp: , Rfl:    Social History: Reviewed -  reports that she has never smoked. She does not have any smokeless tobacco history on file.  Objective Findings:  Vitals: Blood pressure 140/90, pulse 78, height 5\' 4"  (1.626 m), weight 237 lb (107.5 kg), last menstrual period 10/15/2011.  Physical  Examination: General appearance - alert, well appearing, and in no distress, oriented to person, place, and time and overweight Mental status - alert, oriented to person, place, and time, normal mood, behavior, speech, dress, motor activity, and thought processes Eyes - pupils equal and reactive, extraocular eye movements intact Lymphatics - no hepatosplenomegaly, inguinal tenderness Abdomen - soft, nontender, nondistended, no masses or organomegaly no rebound tenderness noted Tenderness and left inguinal area no masses or hernias Pelvic - VULVA: vulvar tenderness gluteal crease where a 1.5 cm hard not with some surrounding edema is present. Is not erythematous at this time, probably represents the folliculitis. Options of surgical opening versus antibiotics discussed External genitalia otherwise normal vaginal exam normal secretions pelvic support good no lesion seen on speculum exam  Assessment & Plan:   A:  1. Folliculitis left gluteal crease .2. Probable left inguinal adenopathy 3. Normal pelvic ultrasound For stable urge incontinence. Treated by urologist    P:  1. R%x doxycycline x 10 d 2. Consider incision and drainage of follicullitis if not improved in 72 hours

## 2017-04-01 ENCOUNTER — Ambulatory Visit (HOSPITAL_COMMUNITY)
Admission: RE | Admit: 2017-04-01 | Discharge: 2017-04-01 | Disposition: A | Discharge status: Home / Self Care | Payer: 59 | Source: Ambulatory Visit | Attending: Orthopedic Surgery | Admitting: Orthopedic Surgery

## 2017-04-01 DIAGNOSIS — M25561 Pain in right knee: Secondary | ICD-10-CM | POA: Diagnosis not present

## 2017-04-01 DIAGNOSIS — M2241 Chondromalacia patellae, right knee: Secondary | ICD-10-CM | POA: Diagnosis not present

## 2017-04-01 DIAGNOSIS — X58XXXA Exposure to other specified factors, initial encounter: Secondary | ICD-10-CM | POA: Diagnosis not present

## 2017-04-01 DIAGNOSIS — S83241A Other tear of medial meniscus, current injury, right knee, initial encounter: Secondary | ICD-10-CM | POA: Diagnosis not present

## 2017-04-01 DIAGNOSIS — M25461 Effusion, right knee: Secondary | ICD-10-CM | POA: Diagnosis not present

## 2017-04-01 DIAGNOSIS — M7121 Synovial cyst of popliteal space [Baker], right knee: Secondary | ICD-10-CM | POA: Diagnosis not present

## 2017-04-10 ENCOUNTER — Encounter: Payer: Self-pay | Admitting: Obstetrics and Gynecology

## 2017-04-10 ENCOUNTER — Ambulatory Visit: Payer: 59 | Admitting: Obstetrics and Gynecology

## 2017-06-10 ENCOUNTER — Encounter (HOSPITAL_BASED_OUTPATIENT_CLINIC_OR_DEPARTMENT_OTHER): Payer: Self-pay | Admitting: Physician Assistant

## 2017-06-10 DIAGNOSIS — S83241D Other tear of medial meniscus, current injury, right knee, subsequent encounter: Secondary | ICD-10-CM

## 2017-06-10 DIAGNOSIS — S83241A Other tear of medial meniscus, current injury, right knee, initial encounter: Secondary | ICD-10-CM

## 2017-06-11 ENCOUNTER — Encounter (HOSPITAL_BASED_OUTPATIENT_CLINIC_OR_DEPARTMENT_OTHER): Payer: Self-pay | Admitting: Physician Assistant

## 2017-06-11 NOTE — H&P (Signed)
Deborah Kennedy is an 56 y.o. female.   Chief Complaint: right knee medial meniscus tear HPI: Deborah Kennedy is the 51 year-old wife of Dr. Arther Abbott seen for evaluation for one month of significant right knee pain after hiring a personal trainer and doing a lot of squats and lunges.  Sharp stabbing medial joint line pain with some stiffness and swelling.  Also pain radiating into her posterior thigh, both at night and during the day with posterior hip pain as well.  She does have a remote history of symphysis pubis during her pregnancy, but this resolved.  Also a history of significant pes planus.  She has stopped doing these workouts because they were too painful.  She has been on Ibuprofen and Tylenol.     Past Medical History:  Diagnosis Date  . Acute medial meniscus tear, right, subsequent encounter   . Anxiety   . Complication of anesthesia    reaction to epidural medication  . Depression     Past Surgical History:  Procedure Laterality Date  . ABDOMINAL HYSTERECTOMY  10/18/2011   Procedure: HYSTERECTOMY ABDOMINAL;  Surgeon: Jonnie Kind, MD;  Location: [facility];  Service: Gynecology;  Laterality: N/A;  . CERVICAL FUSION  2008  . TUBAL LIGATION      Family History  Problem Relation Age of Onset  . Hypertension Mother   . Heart Problems Sister    Social History:  reports that she has never smoked. She has never used smokeless tobacco. She reports that she does not drink alcohol or use drugs.  Allergies: No Known Allergies  No prescriptions prior to admission.    No results found for this or any previous visit (from the past 48 hour(s)). No results found.  Review of Systems  Constitutional: Negative.   HENT: Negative.   Eyes: Negative.   Respiratory: Negative.   Cardiovascular: Negative.   Gastrointestinal: Negative.   Genitourinary: Negative.   Musculoskeletal: Positive for joint pain.  Skin: Negative.   Neurological: Negative.   Endo/Heme/Allergies:  Negative.   Psychiatric/Behavioral: Negative.     Last menstrual period 10/15/2011. Physical Exam  Constitutional: She is oriented to person, place, and time. She appears well-developed and well-nourished.  HENT:  Head: Normocephalic and atraumatic.  Eyes: Pupils are equal, round, and reactive to light. Conjunctivae are normal.  Neck: Neck supple.  Cardiovascular: Normal rate.   Respiratory: Effort normal.  GI: Soft.  Genitourinary:  Genitourinary Comments: Not pertinent to current symptomatology therefore not examined.  Musculoskeletal:  Examination of her right knee reveals pain on the medial joint line.  Positive medial McMurray's.  1+ effusion.  Range of motion 0-120 degrees.  Knee is stable with normal patella tracking.  Examination of the left knee reveals full range of motion without pain, swelling, weakness or instability.    Neurological: She is alert and oriented to person, place, and time.  Skin: Skin is warm and dry.  Psychiatric: She has a normal mood and affect.     Assessment Active Problems:   Acute medial meniscus tear, right, subsequent encounter   Plan I spoke to Deborah Kennedy, who is Dr. Dorothyann Peng Lococo's wife, concerning her right knee MRI that has revealed a medial meniscus tear.  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  Minimal degenerative changes.  I have told her and him with these findings and her persistent pain would recommend that we proceed with right knee arthroscopy with attention to her meniscal pathology.  Risks, complications  and benefits of the surgery have been described to them in detail and they understand this completely.    Linda Hedges, PA-C 06/11/2017, 11:50 AM

## 2017-06-14 ENCOUNTER — Encounter (HOSPITAL_BASED_OUTPATIENT_CLINIC_OR_DEPARTMENT_OTHER)
Admission: RE | Disposition: A | Discharge status: Home / Self Care | Payer: Self-pay | Source: Ambulatory Visit | Attending: Orthopedic Surgery

## 2017-06-14 ENCOUNTER — Ambulatory Visit (HOSPITAL_BASED_OUTPATIENT_CLINIC_OR_DEPARTMENT_OTHER)
Admission: RE | Admit: 2017-06-14 | Discharge: 2017-06-14 | Disposition: A | Discharge status: Home / Self Care | Payer: 59 | Source: Ambulatory Visit | Attending: Orthopedic Surgery | Admitting: Orthopedic Surgery

## 2017-06-14 ENCOUNTER — Ambulatory Visit (HOSPITAL_BASED_OUTPATIENT_CLINIC_OR_DEPARTMENT_OTHER): Payer: 59 | Admitting: Certified Registered"

## 2017-06-14 ENCOUNTER — Encounter (HOSPITAL_BASED_OUTPATIENT_CLINIC_OR_DEPARTMENT_OTHER): Payer: Self-pay | Admitting: Anesthesiology

## 2017-06-14 DIAGNOSIS — S83241A Other tear of medial meniscus, current injury, right knee, initial encounter: Secondary | ICD-10-CM | POA: Diagnosis not present

## 2017-06-14 DIAGNOSIS — S83241D Other tear of medial meniscus, current injury, right knee, subsequent encounter: Secondary | ICD-10-CM

## 2017-06-14 DIAGNOSIS — M94261 Chondromalacia, right knee: Secondary | ICD-10-CM | POA: Insufficient documentation

## 2017-06-14 DIAGNOSIS — F419 Anxiety disorder, unspecified: Secondary | ICD-10-CM | POA: Insufficient documentation

## 2017-06-14 DIAGNOSIS — R109 Unspecified abdominal pain: Secondary | ICD-10-CM | POA: Diagnosis not present

## 2017-06-14 DIAGNOSIS — F329 Major depressive disorder, single episode, unspecified: Secondary | ICD-10-CM | POA: Diagnosis not present

## 2017-06-14 DIAGNOSIS — S83281A Other tear of lateral meniscus, current injury, right knee, initial encounter: Secondary | ICD-10-CM | POA: Diagnosis not present

## 2017-06-14 HISTORY — DX: Other tear of medial meniscus, current injury, right knee, subsequent encounter: S83.241D

## 2017-06-14 HISTORY — PX: KNEE ARTHROSCOPY WITH MEDIAL MENISECTOMY: SHX5651

## 2017-06-14 SURGERY — ARTHROSCOPY, KNEE, WITH MEDIAL MENISCECTOMY
Anesthesia: General | Site: Knee | Laterality: Right

## 2017-06-14 MED ORDER — EPINEPHRINE 30 MG/30ML IJ SOLN
INTRAMUSCULAR | Status: AC
  Filled 2017-06-14: qty 1

## 2017-06-14 MED ORDER — LACTATED RINGERS IV SOLN: INTRAVENOUS | Status: DC

## 2017-06-14 MED ORDER — HYDROCODONE-ACETAMINOPHEN 5-325 MG PO TABS: ORAL_TABLET | ORAL | 0 refills | Status: DC

## 2017-06-14 MED ORDER — PROPOFOL 500 MG/50ML IV EMUL
INTRAVENOUS | Status: AC
  Filled 2017-06-14: qty 50

## 2017-06-14 MED ORDER — HYDROMORPHONE HCL 1 MG/ML IJ SOLN
INTRAMUSCULAR | Status: AC
Start: 2017-06-14 — End: ?
  Filled 2017-06-14: qty 0.5

## 2017-06-14 MED ORDER — OXYCODONE HCL 5 MG/5ML PO SOLN
5.0000 mg | Freq: Once | ORAL | Status: DC | PRN
Start: 2017-06-14 — End: 2017-06-14

## 2017-06-14 MED ORDER — MIDAZOLAM HCL 2 MG/2ML IJ SOLN
INTRAMUSCULAR | Status: AC
  Filled 2017-06-14: qty 2

## 2017-06-14 MED ORDER — SODIUM CHLORIDE 0.9 % IR SOLN
Status: DC | PRN
  Administered 2017-06-14: 3000 mL

## 2017-06-14 MED ORDER — SCOPOLAMINE 1 MG/3DAYS TD PT72
1.0000 | MEDICATED_PATCH | Freq: Once | TRANSDERMAL | Status: DC | PRN
  Administered 2017-06-14: 1.5 mg via TRANSDERMAL

## 2017-06-14 MED ORDER — FENTANYL CITRATE (PF) 100 MCG/2ML IJ SOLN
50.0000 ug | INTRAMUSCULAR | Status: DC | PRN
  Administered 2017-06-14 (×2): 50 ug via INTRAVENOUS

## 2017-06-14 MED ORDER — EPHEDRINE SULFATE 50 MG/ML IJ SOLN
INTRAMUSCULAR | Status: DC | PRN
  Administered 2017-06-14: 10 mg via INTRAVENOUS

## 2017-06-14 MED ORDER — DEXAMETHASONE SODIUM PHOSPHATE 10 MG/ML IJ SOLN
INTRAMUSCULAR | Status: DC | PRN
  Administered 2017-06-14: 10 mg via INTRAVENOUS

## 2017-06-14 MED ORDER — LIDOCAINE HCL (CARDIAC) 20 MG/ML IV SOLN
INTRAVENOUS | Status: DC | PRN
  Administered 2017-06-14: 60 mg via INTRAVENOUS

## 2017-06-14 MED ORDER — OXYCODONE HCL 5 MG PO TABS: 5.0000 mg | ORAL_TABLET | Freq: Once | ORAL | Status: DC | PRN

## 2017-06-14 MED ORDER — CHLORHEXIDINE GLUCONATE 4 % EX LIQD: 60.0000 mL | Freq: Once | CUTANEOUS | Status: DC

## 2017-06-14 MED ORDER — PROPOFOL 10 MG/ML IV BOLUS
INTRAVENOUS | Status: DC | PRN
  Administered 2017-06-14: 150 mg via INTRAVENOUS

## 2017-06-14 MED ORDER — BUPIVACAINE-EPINEPHRINE (PF) 0.25% -1:200000 IJ SOLN
INTRAMUSCULAR | Status: AC
  Filled 2017-06-14: qty 30

## 2017-06-14 MED ORDER — METHYLPREDNISOLONE ACETATE 80 MG/ML IJ SUSP
INTRAMUSCULAR | Status: AC
  Filled 2017-06-14: qty 1

## 2017-06-14 MED ORDER — HYDROMORPHONE HCL 1 MG/ML IJ SOLN
0.2500 mg | INTRAMUSCULAR | Status: DC | PRN
  Administered 2017-06-14 (×2): 0.5 mg via INTRAVENOUS

## 2017-06-14 MED ORDER — BUPIVACAINE-EPINEPHRINE 0.25% -1:200000 IJ SOLN
INTRAMUSCULAR | Status: DC | PRN
  Administered 2017-06-14: 30 mL

## 2017-06-14 MED ORDER — POVIDONE-IODINE 7.5 % EX SOLN: Freq: Once | CUTANEOUS | Status: DC

## 2017-06-14 MED ORDER — CEFAZOLIN SODIUM-DEXTROSE 2-4 GM/100ML-% IV SOLN
INTRAVENOUS | Status: AC
  Filled 2017-06-14: qty 100

## 2017-06-14 MED ORDER — SCOPOLAMINE 1 MG/3DAYS TD PT72
MEDICATED_PATCH | TRANSDERMAL | Status: AC
  Filled 2017-06-14: qty 1

## 2017-06-14 MED ORDER — HYDROMORPHONE HCL 1 MG/ML IJ SOLN
INTRAMUSCULAR | Status: AC
  Filled 2017-06-14: qty 0.5

## 2017-06-14 MED ORDER — ACETAMINOPHEN 500 MG PO TABS: 1000.0000 mg | ORAL_TABLET | Freq: Four times a day (QID) | ORAL | 0 refills | Status: DC | PRN

## 2017-06-14 MED ORDER — MEPERIDINE HCL 25 MG/ML IJ SOLN: 6.2500 mg | INTRAMUSCULAR | Status: DC | PRN

## 2017-06-14 MED ORDER — LACTATED RINGERS IV SOLN
INTRAVENOUS | Status: DC
  Administered 2017-06-14 (×2): via INTRAVENOUS

## 2017-06-14 MED ORDER — FENTANYL CITRATE (PF) 100 MCG/2ML IJ SOLN
INTRAMUSCULAR | Status: AC
  Filled 2017-06-14: qty 2

## 2017-06-14 MED ORDER — ONDANSETRON HCL 4 MG/2ML IJ SOLN
INTRAMUSCULAR | Status: DC | PRN
  Administered 2017-06-14: 4 mg via INTRAVENOUS

## 2017-06-14 MED ORDER — PROMETHAZINE HCL 25 MG/ML IJ SOLN: 6.2500 mg | INTRAMUSCULAR | Status: DC | PRN

## 2017-06-14 MED ORDER — CEFAZOLIN SODIUM-DEXTROSE 2-4 GM/100ML-% IV SOLN
2.0000 g | INTRAVENOUS | Status: AC
  Administered 2017-06-14: 2 g via INTRAVENOUS

## 2017-06-14 MED ORDER — ONDANSETRON HCL 4 MG/2ML IJ SOLN
INTRAMUSCULAR | Status: AC
  Filled 2017-06-14: qty 2

## 2017-06-14 MED ORDER — METHYLPREDNISOLONE ACETATE 80 MG/ML IJ SUSP
INTRAMUSCULAR | Status: DC | PRN
  Administered 2017-06-14: 80 mg

## 2017-06-14 MED ORDER — DEXAMETHASONE SODIUM PHOSPHATE 10 MG/ML IJ SOLN
INTRAMUSCULAR | Status: AC
  Filled 2017-06-14: qty 1

## 2017-06-14 MED ORDER — MIDAZOLAM HCL 2 MG/2ML IJ SOLN
1.0000 mg | INTRAMUSCULAR | Status: DC | PRN
  Administered 2017-06-14: 2 mg via INTRAVENOUS

## 2017-06-14 MED ORDER — DEXAMETHASONE SODIUM PHOSPHATE 10 MG/ML IJ SOLN
INTRAMUSCULAR | Status: AC
  Filled 2017-06-14: qty 3

## 2017-06-14 MED ORDER — ONDANSETRON HCL 4 MG/2ML IJ SOLN
INTRAMUSCULAR | Status: AC
  Filled 2017-06-14: qty 12

## 2017-06-14 SURGICAL SUPPLY — 59 items
APL SKNCLS STERI-STRIP NONHPOA (GAUZE/BANDAGES/DRESSINGS)
BANDAGE ACE 6X5 VEL STRL LF (GAUZE/BANDAGES/DRESSINGS) ×2 IMPLANT
BANDAGE ESMARK 6X9 LF (GAUZE/BANDAGES/DRESSINGS) IMPLANT
BENZOIN TINCTURE PRP APPL 2/3 (GAUZE/BANDAGES/DRESSINGS) IMPLANT
BLADE CUDA GRT WHITE 3.5 (BLADE) IMPLANT
BLADE CUTTER GATOR 3.5 (BLADE) ×2 IMPLANT
BLADE GREAT WHITE 4.2 (BLADE) ×1 IMPLANT
BLADE SURG 15 STRL LF DISP TIS (BLADE) IMPLANT
BLADE SURG 15 STRL SS (BLADE)
BNDG CMPR 9X6 STRL LF SNTH (GAUZE/BANDAGES/DRESSINGS)
BNDG COHESIVE 4X5 TAN STRL (GAUZE/BANDAGES/DRESSINGS) IMPLANT
BNDG ESMARK 6X9 LF (GAUZE/BANDAGES/DRESSINGS)
DRAPE ARTHROSCOPY W/POUCH 90 (DRAPES) ×2 IMPLANT
DURAPREP 26ML APPLICATOR (WOUND CARE) ×2 IMPLANT
GAUZE SPONGE 4X4 12PLY STRL (GAUZE/BANDAGES/DRESSINGS) ×2 IMPLANT
GAUZE XEROFORM 1X8 LF (GAUZE/BANDAGES/DRESSINGS) ×2 IMPLANT
GLOVE BIO SURGEON STRL SZ7 (GLOVE) ×2 IMPLANT
GLOVE BIOGEL PI IND STRL 7.0 (GLOVE) ×1 IMPLANT
GLOVE BIOGEL PI IND STRL 7.5 (GLOVE) ×1 IMPLANT
GLOVE BIOGEL PI INDICATOR 7.0 (GLOVE) ×3
GLOVE BIOGEL PI INDICATOR 7.5 (GLOVE) ×1
GLOVE ECLIPSE 6.5 STRL STRAW (GLOVE) ×1 IMPLANT
GLOVE SS BIOGEL STRL SZ 7.5 (GLOVE) ×1 IMPLANT
GLOVE SUPERSENSE BIOGEL SZ 7.5 (GLOVE) ×1
GOWN STRL REUS W/ TWL LRG LVL3 (GOWN DISPOSABLE) ×3 IMPLANT
GOWN STRL REUS W/TWL LRG LVL3 (GOWN DISPOSABLE) ×4
HOLDER KNEE FOAM BLUE (MISCELLANEOUS) ×2 IMPLANT
K-WIRE .062X4 (WIRE) IMPLANT
KNEE WRAP E Z 3 GEL PACK (MISCELLANEOUS) ×2 IMPLANT
MANIFOLD NEPTUNE II (INSTRUMENTS) ×1 IMPLANT
NDL SAFETY ECLIPSE 18X1.5 (NEEDLE) ×2 IMPLANT
NEEDLE HYPO 18GX1.5 SHARP (NEEDLE) ×4
NEEDLE HYPO 22GX1.5 SAFETY (NEEDLE) IMPLANT
PACK ARTHROSCOPY DSU (CUSTOM PROCEDURE TRAY) ×2 IMPLANT
PACK BASIN DAY SURGERY FS (CUSTOM PROCEDURE TRAY) ×2 IMPLANT
PAD ALCOHOL SWAB (MISCELLANEOUS) ×3 IMPLANT
SET ARTHROSCOPY TUBING (MISCELLANEOUS)
SET ARTHROSCOPY TUBING LN (MISCELLANEOUS) ×1 IMPLANT
SHEET MEDIUM DRAPE 40X70 STRL (DRAPES) ×2 IMPLANT
STRIP CLOSURE SKIN 1/2X4 (GAUZE/BANDAGES/DRESSINGS) IMPLANT
SUCTION FRAZIER HANDLE 10FR (MISCELLANEOUS)
SUCTION TUBE FRAZIER 10FR DISP (MISCELLANEOUS) IMPLANT
SUT ETHILON 4 0 PS 2 18 (SUTURE) ×2 IMPLANT
SUT FIBERWIRE #2 38 T-5 BLUE (SUTURE)
SUT PDS AB 0 CT 36 (SUTURE) IMPLANT
SUT PROLENE 3 0 PS 2 (SUTURE) IMPLANT
SUT VIC AB 0 CT1 18XCR BRD 8 (SUTURE) IMPLANT
SUT VIC AB 0 CT1 8-18 (SUTURE)
SUT VIC AB 2-0 CT1 27 (SUTURE)
SUT VIC AB 2-0 CT1 TAPERPNT 27 (SUTURE) IMPLANT
SUT VIC AB 3-0 PS1 18 (SUTURE)
SUT VIC AB 3-0 PS1 18XBRD (SUTURE) IMPLANT
SUTURE FIBERWR #2 38 T-5 BLUE (SUTURE) IMPLANT
SYR 20CC LL (SYRINGE) IMPLANT
SYR 5ML LL (SYRINGE) ×2 IMPLANT
TOWEL OR 17X24 6PK STRL BLUE (TOWEL DISPOSABLE) ×2 IMPLANT
TUBING ARTHROSCOPY IRRIG 16FT (MISCELLANEOUS) ×1 IMPLANT
WAND STAR VAC 90 (SURGICAL WAND) ×1 IMPLANT
WATER STERILE IRR 1000ML POUR (IV SOLUTION) ×2 IMPLANT

## 2017-06-14 NOTE — Anesthesia Preprocedure Evaluation (Addendum)
Anesthesia Evaluation  Patient identified by MRN, date of birth, ID band Patient awake    Reviewed: Allergy & Precautions, NPO status , Patient's Chart, lab work & pertinent test results  Airway Mallampati: I  TM Distance: >3 FB Neck ROM: Full    Dental  (+) Teeth Intact, Dental Advisory Given   Pulmonary neg pulmonary ROS,    breath sounds clear to auscultation       Cardiovascular negative cardio ROS   Rhythm:Regular Rate:Normal     Neuro/Psych PSYCHIATRIC DISORDERS Anxiety Depression negative neurological ROS     GI/Hepatic negative GI ROS, Neg liver ROS,   Endo/Other  negative endocrine ROS  Renal/GU negative Renal ROS     Musculoskeletal negative musculoskeletal ROS (+)   Abdominal   Peds  Hematology negative hematology ROS (+)   Anesthesia Other Findings Day of surgery medications reviewed with the patient.  Reproductive/Obstetrics                            Anesthesia Physical Anesthesia Plan  ASA: III  Anesthesia Plan: General   Post-op Pain Management:    Induction: Intravenous  PONV Risk Score and Plan: 4 or greater and Ondansetron, Dexamethasone, Midazolam, Scopolamine patch - Pre-op and Treatment may vary due to age or medical condition  Airway Management Planned: LMA  Additional Equipment:   Intra-op Plan:   Post-operative Plan: Extubation in OR  Informed Consent: I have reviewed the patients History and Physical, chart, labs and discussed the procedure including the risks, benefits and alternatives for the proposed anesthesia with the patient or authorized representative who has indicated his/her understanding and acceptance.   Dental advisory given  Plan Discussed with: CRNA  Anesthesia Plan Comments:        Anesthesia Quick Evaluation

## 2017-06-14 NOTE — Transfer of Care (Signed)
Immediate Anesthesia Transfer of Care Note  Patient: Armed forces technical officer  Procedure(s) Performed: Procedure(s): RIGHT KNEE ARTHROSCOPY WITH MEDIAL MENISECTOMY (Right)  Patient Location: PACU  Anesthesia Type:General  Level of Consciousness: awake, alert , oriented and patient cooperative  Airway & Oxygen Therapy: Patient Spontanous Breathing and Patient connected to face mask oxygen  Post-op Assessment: Report given to RN and Post -op Vital signs reviewed and stable  Post vital signs: Reviewed and stable  Last Vitals:  Vitals:   06/14/17 0833  BP: 126/84  Pulse: 66  Resp: 18  Temp: 36.6 C  SpO2: 100%    Last Pain:  Vitals:   06/14/17 0833  TempSrc: Oral         Complications: No apparent anesthesia complications

## 2017-06-14 NOTE — Interval H&P Note (Signed)
History and Physical Interval Note:  06/14/2017 10:29 AM  Deborah Kennedy  has presented today for surgery, with the diagnosis of Other tear of medial meniscus current injury right knee  S83.241  The various methods of treatment have been discussed with the patient and family. After consideration of risks, benefits and other options for treatment, the patient has consented to  Procedure(s): RIGHT KNEE ARTHROSCOPY WITH MEDIAL MENISECTOMY (Right) as a surgical intervention .  The patient's history has been reviewed, patient examined, no change in status, stable for surgery.  I have reviewed the patient's chart and labs.  Questions were answered to the patient's satisfaction.     Elsie Saas A

## 2017-06-14 NOTE — Anesthesia Procedure Notes (Signed)
Procedure Name: LMA Insertion Date/Time: 06/14/2017 10:40 AM Performed by: Jeanise Durfey D Pre-anesthesia Checklist: Patient identified, Emergency Drugs available, Suction available and Patient being monitored Patient Re-evaluated:Patient Re-evaluated prior to induction Oxygen Delivery Method: Circle system utilized Preoxygenation: Pre-oxygenation with 100% oxygen Induction Type: IV induction Ventilation: Mask ventilation without difficulty LMA: LMA inserted LMA Size: 3.0 Number of attempts: 1 Airway Equipment and Method: Bite block Placement Confirmation: positive ETCO2 Tube secured with: Tape Dental Injury: Teeth and Oropharynx as per pre-operative assessment

## 2017-06-14 NOTE — Discharge Instructions (Signed)
Regional Anesthesia Blocks ? ?1. Numbness or the inability to move the "blocked" extremity may last from 3-48 hours after placement. The length of time depends on the medication injected and your individual response to the medication. If the numbness is not going away after 48 hours, call your surgeon. ? ?2. The extremity that is blocked will need to be protected until the numbness is gone and the  Strength has returned. Because you cannot feel it, you will need to take extra care to avoid injury. Because it may be weak, you may have difficulty moving it or using it. You may not know what position it is in without looking at it while the block is in effect. ? ?3. For blocks in the legs and feet, returning to weight bearing and walking needs to be done carefully. You will need to wait until the numbness is entirely gone and the strength has returned. You should be able to move your leg and foot normally before you try and bear weight or walk. You will need someone to be with you when you first try to ensure you do not fall and possibly risk injury. ? ?4. Bruising and tenderness at the needle site are common side effects and will resolve in a few days. ? ?5. Persistent numbness or new problems with movement should be communicated to the surgeon or the [facility] ([phone])/ [facility] ([phone]).  ? ?Post Anesthesia Home Care Instructions ? ?Activity: ?Get plenty of rest for the remainder of the day. A responsible individual must stay with you for 24 hours following the procedure.  ?For the next 24 hours, DO NOT: ?-Drive a car ?-Operate machinery ?-Drink alcoholic beverages ?-Take any medication unless instructed by your physician ?-Make any legal decisions or sign important papers. ? ?Meals: ?Start with liquid foods such as gelatin or soup. Progress to regular foods as tolerated. Avoid greasy, spicy, heavy foods. If nausea and/or vomiting occur, drink only clear liquids until the  nausea and/or vomiting subsides. Call your physician if vomiting continues. ? ?Special Instructions/Symptoms: ?Your throat may feel dry or sore from the anesthesia or the breathing tube placed in your throat during surgery. If this causes discomfort, gargle with warm salt water. The discomfort should disappear within 24 hours. ? ?If you had a scopolamine patch placed behind your ear for the management of post- operative nausea and/or vomiting: ? ?1. The medication in the patch is effective for 72 hours, after which it should be removed.  Wrap patch in a tissue and discard in the trash. Wash hands thoroughly with soap and water. ?2. You may remove the patch earlier than 72 hours if you experience unpleasant side effects which may include dry mouth, dizziness or visual disturbances. ?3. Avoid touching the patch. Wash your hands with soap and water after contact with the patch. ?    ?

## 2017-06-14 NOTE — Anesthesia Postprocedure Evaluation (Signed)
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  Procedure(s) Performed: Procedure(s) (LRB): RIGHT KNEE ARTHROSCOPY WITH MEDIAL MENISECTOMY (Right)     Patient location during evaluation: PACU Anesthesia Type: General Level of consciousness: awake and alert Pain management: pain level controlled Vital Signs Assessment: post-procedure vital signs reviewed and stable Respiratory status: spontaneous breathing, nonlabored ventilation, respiratory function stable and patient connected to nasal cannula oxygen Cardiovascular status: blood pressure returned to baseline and stable Postop Assessment: no signs of nausea or vomiting Anesthetic complications: no    Last Vitals:  Vitals:   06/14/17 1226 06/14/17 1230  BP: (!) 163/92 (!) 155/95  Pulse: 66 66  Resp: 15 13  Temp:    SpO2: 100% 100%    Last Pain:  Vitals:   06/14/17 1230  TempSrc:   PainSc: Asleep    LLE Motor Response: Purposeful movement (06/14/17 1230) LLE Sensation: Decreased (06/14/17 1230)          Effie Berkshire

## 2017-06-14 NOTE — Op Note (Signed)
NAME:  Deborah Kennedy, Deborah Kennedy                ACCOUNT NO.:  MEDICAL RECORD NO.:  [id number]  LOCATION:                                 FACILITY:  PHYSICIAN:  Sheneika Walstad A. Noemi Chapel, M.D.      DATE OF BIRTH:  DATE OF PROCEDURE:  06/14/2017 DATE OF DISCHARGE:                              OPERATIVE REPORT   PREOPERATIVE DIAGNOSES: 1. Right knee acute traumatic medial and lateral meniscal tears. 2. Right knee chondromalacia.  POSTOPERATIVE DIAGNOSES: 1. Right knee acute traumatic medial and lateral meniscal tears. 2. Right knee chondromalacia.  PROCEDURE: 1. Right knee EUA, followed by arthroscopic partial medial and lateral     meniscectomies. 2. Right knee chondroplasty.  SURGEON:  Audree Camel. Noemi Chapel, M.D.  ASSISTING:  Matthew Saras, PA.  ANESTHESIA:  General.  OPERATIVE TIME:  30 minutes.  COMPLICATIONS:  None.  INDICATION FOR PROCEDURE:  [name] is a 56 year old woman, who has had 6 months of right knee pain from a twisting injury.  Exam and MRIs revealed medial meniscus tear, partial lateral meniscus tear with chondromalacia.  She has failed conservative care and is now to undergo arthroscopy.  DESCRIPTION:  [name] is brought to the operating room on June 14, 2017, placed on operative table in supine position.  After being placed under general anesthesia, her right knee was examined.  She had full range of motion.  Knee was stable ligamentous exam with normal patellar tracking.  The knee was sterilely injected with 0.25% Marcaine with epinephrine.  She was received antibiotics preoperatively for prophylaxis.  Right leg was prepped using sterile DuraPrep and draped using sterile technique.  Time-out procedure was called, and the correct right knee identified.  Initially, through an anterolateral portal, the arthroscope with a pump attached was placed into an anteromedial portal and arthroscopic probe was placed.  On initial inspection of medial compartment,  she had 30-40% grade 3 chondromalacia, which was debrided. Medial meniscus showed tearing of the posterior medial horn of which 50% was resected back to a stable rim.  Intercondylar notch inspected. Anterior and posterior cruciate ligaments were normal.  Lateral compartment inspected.  The articular cartilage showed on the tibial plateau, 75% grade 3 and 10% grade 4 chondromalacia, which was debrided. Lateral femoral condyle showed grade 1 and 2 chondromalacia.  Lateral meniscus showed tearing of the posterior and lateral horn, 25% which was resected back to a stable rim.  Patellofemoral joint showed 50-60% grade 3 chondromalacia, which was debrided.  The patella tracked normally. Medial and lateral gutters were free of pathology.  After this done, it was felt that all pathology had been satisfactorily addressed.  The instruments were removed.  Portal was closed with 3-0 nylon suture.  The knee was injected with 80 mg of Depo-Medrol and 10 mL of Marcaine with epinephrine.  Sterile dressings were applied and the patient awakened, taken to recovery room in stable condition.  FOLLOWUP CARE:  [name] will be followed as an outpatient on hydrocodone for pain.  She will be seen back in office in a week for sutures out and followup.     Marcelus Dubberly A. Noemi Chapel, M.D.     RAW/MEDQ  D:  06/14/2017  T:  06/14/2017  Job:  [id number]

## 2017-06-17 ENCOUNTER — Encounter (HOSPITAL_BASED_OUTPATIENT_CLINIC_OR_DEPARTMENT_OTHER): Payer: Self-pay | Admitting: Orthopedic Surgery

## 2017-06-20 DIAGNOSIS — S83241D Other tear of medial meniscus, current injury, right knee, subsequent encounter: Secondary | ICD-10-CM | POA: Diagnosis not present

## 2017-06-20 DIAGNOSIS — S83281D Other tear of lateral meniscus, current injury, right knee, subsequent encounter: Secondary | ICD-10-CM | POA: Diagnosis not present

## 2017-07-11 DIAGNOSIS — S83281D Other tear of lateral meniscus, current injury, right knee, subsequent encounter: Secondary | ICD-10-CM | POA: Diagnosis not present

## 2017-07-11 DIAGNOSIS — S83241D Other tear of medial meniscus, current injury, right knee, subsequent encounter: Secondary | ICD-10-CM | POA: Diagnosis not present

## 2017-07-30 ENCOUNTER — Other Ambulatory Visit (HOSPITAL_COMMUNITY): Payer: Self-pay | Admitting: Orthopedic Surgery

## 2017-07-30 ENCOUNTER — Ambulatory Visit (HOSPITAL_COMMUNITY)
Admission: RE | Admit: 2017-07-30 | Discharge: 2017-07-30 | Disposition: A | Discharge status: Home / Self Care | Payer: 59 | Source: Ambulatory Visit | Attending: Vascular Surgery | Admitting: Vascular Surgery

## 2017-07-30 DIAGNOSIS — M7989 Other specified soft tissue disorders: Secondary | ICD-10-CM | POA: Insufficient documentation

## 2017-07-30 DIAGNOSIS — S83281D Other tear of lateral meniscus, current injury, right knee, subsequent encounter: Secondary | ICD-10-CM | POA: Diagnosis not present

## 2017-07-30 DIAGNOSIS — M79661 Pain in right lower leg: Secondary | ICD-10-CM

## 2017-07-30 DIAGNOSIS — S83241D Other tear of medial meniscus, current injury, right knee, subsequent encounter: Secondary | ICD-10-CM | POA: Diagnosis not present

## 2017-08-01 DIAGNOSIS — S83281D Other tear of lateral meniscus, current injury, right knee, subsequent encounter: Secondary | ICD-10-CM | POA: Diagnosis not present

## 2017-08-01 DIAGNOSIS — S83241D Other tear of medial meniscus, current injury, right knee, subsequent encounter: Secondary | ICD-10-CM | POA: Diagnosis not present

## 2017-08-07 ENCOUNTER — Ambulatory Visit (HOSPITAL_COMMUNITY): Payer: Self-pay

## 2017-08-08 ENCOUNTER — Encounter (HOSPITAL_COMMUNITY): Payer: Self-pay

## 2017-08-08 ENCOUNTER — Ambulatory Visit (HOSPITAL_COMMUNITY): Payer: 59 | Attending: Orthopedic Surgery

## 2017-08-08 DIAGNOSIS — R262 Difficulty in walking, not elsewhere classified: Secondary | ICD-10-CM | POA: Insufficient documentation

## 2017-08-08 DIAGNOSIS — M6281 Muscle weakness (generalized): Secondary | ICD-10-CM | POA: Insufficient documentation

## 2017-08-08 DIAGNOSIS — M25561 Pain in right knee: Secondary | ICD-10-CM | POA: Diagnosis not present

## 2017-08-08 DIAGNOSIS — R29898 Other symptoms and signs involving the musculoskeletal system: Secondary | ICD-10-CM | POA: Insufficient documentation

## 2017-08-08 NOTE — Patient Instructions (Signed)
  SINGLE LEG STANCE - SLS  Stand on one leg and maintain your balance.  Perform 1x/day, 2-3 sets of 10 holding for at least 10 seconds

## 2017-08-09 NOTE — Therapy (Signed)
[facility] [place], [place], [address] Phone: [phone]   Fax:  [phone]  Physical Therapy Evaluation  Patient Details  Name: Deborah Kennedy MRN: [id number] Date of Birth: 08-Apr-1961 Referring Provider: Dr. Noemi Chapel  Encounter Date: 08/08/2017      PT End of Session - 08/08/17 1539    Visit Number 1   Number of Visits 13   Date for PT Re-Evaluation 08/29/17   Authorization Type [facility]   Authorization Time Period 08/08/17 to 09/19/17   Authorization - Visit Number 1   Authorization - Number of Visits 24   PT Start Time 1751   PT Stop Time 1524   PT Time Calculation (min) 48 min   Activity Tolerance Patient tolerated treatment well   Behavior During Therapy [facility] for tasks assessed/performed      Past Medical History:  Diagnosis Date  . Acute medial meniscus tear, right, subsequent encounter   . Anxiety   . Complication of anesthesia    reaction to epidural medication  . Depression     Past Surgical History:  Procedure Laterality Date  . ABDOMINAL HYSTERECTOMY  10/18/2011   Procedure: HYSTERECTOMY ABDOMINAL;  Surgeon: Jonnie Kind, MD;  Location: [facility];  Service: Gynecology;  Laterality: N/A;  . CERVICAL FUSION  2008  . KNEE ARTHROSCOPY WITH MEDIAL MENISECTOMY Right 06/14/2017   Procedure: RIGHT KNEE ARTHROSCOPY WITH MEDIAL MENISECTOMY;  Surgeon: Elsie Saas, MD;  Location: [facility];  Service: Orthopedics;  Laterality: Right;  . TUBAL LIGATION      There were no vitals filed for this visit.       Subjective Assessment - 08/08/17 1443    Subjective Pt states that she had R knee scope on 06/14/17 to correct a meniscus tear. She states that she had a cyst that extended into her calf; they aspirated it last week. She did not have PT following her surgery. She was told to go ride the bike and elliptical following surgery; since doing that her knee pain worsened. She says  that as her cyst grew, she had great difficulty with walking and swelling. She states that she can walk better now that the cyst has been drained, but she is still having a lot of pain in the front of the knee. She states that today is the first day that she hasn't walked without crutches. She denies any issues with her ROM; it is just a constant pain.    Limitations Walking   How long can you sit comfortably? no issues   How long can you stand comfortably? no issues since the cyst aspiration   How long can you walk comfortably? around the house but limping   Patient Stated Goals get back to regular routine   Currently in Pain? Yes   Pain Score 5    Pain Location Knee   Pain Orientation Right;Medial   Pain Descriptors / Indicators Aching   Pain Type Acute pain   Pain Onset More than a month ago   Pain Frequency Constant   Aggravating Factors  walking, standing   Pain Relieving Factors heat, [place]   Effect of Pain on Daily Activities increases            [facility] PT Assessment - 08/09/17 0001      Assessment   Medical Diagnosis s/p R knee scope and OA debridement; L/S R leg radiculopathy   Onset Date/Surgical Date 06/14/17   Next MD Visit  08/20/17   Prior Therapy none     Balance Screen   Has the patient fallen in the past 6 months No   Has the patient had a decrease in activity level because of a fear of falling?  No   Is the patient reluctant to leave their home because of a fear of falling?  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      Strength   Right Hip Flexion 5/5   Left Hip  Flexion 5/5   Right Knee Flexion 4+/5  medial and lateral HS: 3+/5 (tested in prone)   Right Knee Extension 5/5   Left Knee Flexion 5/5   Left Knee Extension 5/5   Right Ankle Dorsiflexion 5/5   Left Ankle Dorsiflexion 5/5     Flexibility   Soft Tissue Assessment /Muscle Length yes   Hamstrings R HS length WNL with 90/90 and SLR     Palpation   Patella mobility WNL   Palpation comment increased tenderness to palpation of medial joint line, medial distal HS very tender R>L     Ambulation/Gait   Ambulation Distance (Feet) 786 Feet  6MWT   Assistive device Straight cane   Gait Pattern Step-through pattern;Decreased step length - left;Decreased stance time - right;Decreased dorsiflexion - right;Decreased weight shift to right;Antalgic  decreased heel strike, increased toe out and pronation on R   Gait velocity 0.6m/s     Balance   Balance Assessed Yes     Static Standing Balance   Static Standing - Balance Support No upper extremity supported   Static Standing Balance -  Activities  Single Leg Stance - Right Leg;Single Leg Stance - Left Leg   Static Standing - Comment/# of Minutes L: 20 sec (3rd attempt), R: 6 sec (3rd attempt)         Objective measurements completed on examination: See above findings.          PT Education - 08/08/17 1539    Education provided Yes   Education Details exam findings, POC, HEP   Person(s) Educated Patient   Methods Explanation;Demonstration;Handout   Comprehension Verbalized understanding;Returned demonstration          PT Short Term Goals - 08/08/17 1621      PT SHORT TERM GOAL #1   Title Pt will be independent with HEP and perform consistenly in order to promote return to PLOF.   Time 3   Period Weeks   Status New   Target Date 08/29/17     PT SHORT TERM GOAL #2   Title Pt will have improved HS MMT to 5/5 and improved medial and lateral HS MMT to at least 4/5 to decrease pain and maximize giat.   Time 3   Period Weeks    Status New           PT Long Term Goals - 08/08/17 1624      PT LONG TERM GOAL #1   Title Pt will have improved 6MWT to at least 1285ft to demo decreased  R knee pain and improved tolerance to ambulation.    Time 6   Period Weeks   Status New   Target Date 09/19/17     PT LONG TERM GOAL #2   Title Pt will have improved SLS on RLE to at least 20 sec to be symmetrical with the LLE in order to maximize gait.   Time 6   Period Weeks   Status New     PT LONG TERM GOAL #3   Title Pt will report walking at least 3x/week with 2/10 R knee pain or less to demo improved overall function and tolerance to movement and promote return to her regular walking program.   Time 6   Period Weeks   Status New     PT LONG TERM GOAL #4   Title Pt will be able to ascend/descend a flight of stairs in a reciprocal manner with 1 or no handrail, good eccentric control throughout, and without R knee pain to demo improved functional strength of her RLE and maximize community access.    Time 6   Period Weeks   Status New                Plan - 08/08/17 1541    Clinical Impression Statement Pt is pleasant 56 YO F who presents to [facility] with c/o R knee pain s/p R knee scope on 06/14/17 to repair a meniscus tear. She has deficits in gait, HS MMT, tenderness to palpation of medial joint line and distal medial HS, and deficits in balance. Per gross assessment, her AROM appeared WNL, but this will be objectively measured next session. Pt medial and lateral portions of HS tested in prone and she was noted to have increased weakness and pain with both. Pt's gait noted to be antalgic with decreased heel strike and weight shifting on the RLE. Pt noted to have increased navicular drop on R compared to L when in WB. Pt ambulated 75ft during the 6MWT, well below her age-matched peers. Prior to her R knee scope, pt was walking 2-3 miles per day. Pt needs skilled PT intervention to maximize return to PLOF and overall  function.    History and Personal Factors relevant to plan of care: R knee scope 06/14/17, very active prior to surgery   Clinical Presentation Stable   Clinical Presentation due to: MMT, HS weakness, palpation, gait, stairs, SLS, pain   Clinical Decision Making Low   Rehab Potential Good   PT Frequency 2x / week   PT Duration 6 weeks   PT Treatment/Interventions ADLs/Self Care Home Management;Cryotherapy;Electrical Stimulation;Moist Heat;Traction;DME Instruction;Gait training;Stair training;Functional mobility training;Therapeutic activities;Therapeutic exercise;Balance training;Neuromuscular re-education;Patient/family education;Manual techniques;Passive range of motion;Dry needling;Taping   PT Next Visit Plan review goal, HEP, measure R knee and ankle AROM, manual to R knee for pain management, functional strengthening, balance   PT Home Exercise Plan eval: SLS   Consulted and Agree with Plan of Care Patient      Patient will benefit from skilled therapeutic intervention in order to improve the following deficits and impairments:  Abnormal gait, Decreased activity tolerance, Decreased balance, Decreased mobility, Decreased strength, Difficulty walking, Increased fascial restricitons, Increased muscle spasms, Pain  Visit Diagnosis: Acute pain of right knee - Plan: PT plan of care cert/re-cert  Muscle weakness (generalized) - Plan: PT plan of care cert/re-cert  Difficulty in walking, not elsewhere classified - Plan: PT plan of care cert/re-cert  Other symptoms and signs involving the musculoskeletal system - Plan: PT plan of  care cert/re-cert     Problem List Patient Active Problem List   Diagnosis Date Noted  . Acute medial meniscus tear, right, subsequent encounter   . VERTIGO 09/06/2009  . FATIGUE 09/06/2009  . OBESITY, MILD 01/07/2008  . Abdominal pain, other specified site 01/07/2008       Geraldine Solar PT, DPT  [facility] [address] [place], [place], [address] Phone: [phone]   Fax:  [phone]  Name: Deborah Kennedy MRN: [id number] Date of Birth: 07-13-1961

## 2017-08-14 ENCOUNTER — Encounter (HOSPITAL_COMMUNITY): Payer: Self-pay

## 2017-08-14 ENCOUNTER — Ambulatory Visit (HOSPITAL_COMMUNITY): Payer: 59

## 2017-08-14 DIAGNOSIS — M6281 Muscle weakness (generalized): Secondary | ICD-10-CM

## 2017-08-14 DIAGNOSIS — R29898 Other symptoms and signs involving the musculoskeletal system: Secondary | ICD-10-CM

## 2017-08-14 DIAGNOSIS — R262 Difficulty in walking, not elsewhere classified: Secondary | ICD-10-CM

## 2017-08-14 DIAGNOSIS — M25561 Pain in right knee: Secondary | ICD-10-CM

## 2017-08-15 ENCOUNTER — Telehealth (HOSPITAL_COMMUNITY): Payer: Self-pay

## 2017-08-15 ENCOUNTER — Ambulatory Visit (HOSPITAL_COMMUNITY): Payer: 59

## 2017-08-15 NOTE — Telephone Encounter (Signed)
No show #1; attempted to call pt regarding missed appointment, however, no answer and no voicemail set up on cell phone and the home phone number was busy.   Geraldine Solar PT, DPT

## 2017-08-15 NOTE — Therapy (Signed)
[place], [place], [address] Phone: [phone]   Fax:  [phone]  Physical Therapy Treatment  Patient Details  Name: Deborah Kennedy MRN: [id number] Date of Birth: 1961-10-14 Referring Provider: Dr. Noemi Chapel  Encounter Date: 08/14/2017      PT End of Session - 08/14/17 1318    Visit Number 2   Number of Visits 13   Date for PT Re-Evaluation 08/29/17   Authorization Type [facility]   Authorization Time Period 08/08/17 to 09/19/17   Authorization - Visit Number 2   Authorization - Number of Visits 24   PT Start Time 5361  pt late   PT Stop Time 1343   PT Time Calculation (min) 25 min   Activity Tolerance Patient tolerated treatment well   Behavior During Therapy [facility] for tasks assessed/performed      Past Medical History:  Diagnosis Date  . Acute medial meniscus tear, right, subsequent encounter   . Anxiety   . Complication of anesthesia    reaction to epidural medication  . Depression     Past Surgical History:  Procedure Laterality Date  . ABDOMINAL HYSTERECTOMY  10/18/2011   Procedure: HYSTERECTOMY ABDOMINAL;  Surgeon: Jonnie Kind, MD;  Location: [facility];  Service: Gynecology;  Laterality: N/A;  . CERVICAL FUSION  2008  . KNEE ARTHROSCOPY WITH MEDIAL MENISECTOMY Right 06/14/2017   Procedure: RIGHT KNEE ARTHROSCOPY WITH MEDIAL MENISECTOMY;  Surgeon: Elsie Saas, MD;  Location: [facility];  Service: Orthopedics;  Laterality: Right;  . TUBAL LIGATION      There were no vitals filed for this visit.      Subjective Assessment - 08/14/17 1319    Subjective Pt states that her R knee was hurting worse following the initial evaluation. She stated that she was unable to walk very far for the rest of that night and then she has just had a lot of pain following that.    Limitations Walking   How long can you sit comfortably? no issues   How long can you stand  comfortably? no issues since the cyst aspiration   How long can you walk comfortably? around the house but limping   Patient Stated Goals get back to regular routine   Currently in Pain? Yes   Pain Score 3    Pain Location Knee   Pain Orientation Right;Medial   Pain Descriptors / Indicators Sore   Pain Type Acute pain   Pain Onset More than a month ago   Pain Frequency Constant   Aggravating Factors  walking, standing,   Pain Relieving Factors heat, Ben-Gay   Effect of Pain on Daily Activities increases         Manual Therapy: Soft tissue mobilization Completed separate rest of treatment Light retro STM to R knee with BLE elevated for pain control           PT Education - 08/14/17 1323    Education provided Yes   Education Details reviewed goals   Person(s) Educated --   Methods Handout;Explanation   Comprehension Verbalized understanding          PT Short Term Goals - 08/08/17 1621      PT SHORT TERM GOAL #1   Title Pt will be independent with HEP and perform consistenly in order to promote return to PLOF.   Time 3   Period Weeks   Status New   Target Date 08/29/17  PT SHORT TERM GOAL #2   Title Pt will have improved HS MMT to 5/5 and improved medial and lateral HS MMT to at least 4/5 to decrease pain and maximize giat.   Time 3   Period Weeks   Status New           PT Long Term Goals - 08/08/17 1624      PT LONG TERM GOAL #1   Title Pt will have improved 6MWT to at least 1253ft to demo decreased R knee pain and improved tolerance to ambulation.    Time 6   Period Weeks   Status New   Target Date 09/19/17     PT LONG TERM GOAL #2   Title Pt will have improved SLS on RLE to at least 20 sec to be symmetrical with the LLE in order to maximize gait.   Time 6   Period Weeks   Status New     PT LONG TERM GOAL #3   Title Pt will report walking at least 3x/week with 2/10 R knee pain or less to demo improved overall function and tolerance to  movement and promote return to her regular walking program.   Time 6   Period Weeks   Status New     PT LONG TERM GOAL #4   Title Pt will be able to ascend/descend a flight of stairs in a reciprocal manner with 1 or no handrail, good eccentric control throughout, and without R knee pain to demo improved functional strength of her RLE and maximize community access.    Time 6   Period Weeks   Status New               Plan - 08/14/17 1344    Clinical Impression Statement Treatment session limited as pt late for appointment. Prior to beginning session, pt stated that her husband wanted to speak to the treating therapist. This PT spoke with her husband and he stated that Dr. Noemi Chapel just wanted the pt's swelling to be managed and that she shouldn't be doing a lot of WB activities. He noted that the pt did stairs and other WB activities during her evaluation and because of that, she was in a significant amount of pain following. PT explained that these WB activities were addressed since the pt c/o having difficulty with them. Pt did express that she had significant pain and issues with walking following last session. Due to the increased pain and time limitations for the treatment session, manual therapy for pain management was the focus of today's session. PT reviewed Dr. Archie Endo referral again which stated "decrease swelling and slowly increase low impact exercise" so PT will ensure this going forward.   Rehab Potential Good   PT Frequency 2x / week   PT Duration 6 weeks   PT Treatment/Interventions ADLs/Self Care Home Management;Cryotherapy;Electrical Stimulation;Moist Heat;Traction;DME Instruction;Gait training;Stair training;Functional mobility training;Therapeutic activities;Therapeutic exercise;Balance training;Neuromuscular re-education;Patient/family education;Manual techniques;Passive range of motion;Dry needling;Taping   PT Next Visit Plan manual to R knee for pain management, NWB  strengthening   PT Home Exercise Plan eval: SLS   Consulted and Agree with Plan of Care Patient      Patient will benefit from skilled therapeutic intervention in order to improve the following deficits and impairments:  Abnormal gait, Decreased activity tolerance, Decreased balance, Decreased mobility, Decreased strength, Difficulty walking, Increased fascial restricitons, Increased muscle spasms, Pain  Visit Diagnosis: Acute pain of right knee  Muscle weakness (generalized)  Difficulty in walking, not  elsewhere classified  Other symptoms and signs involving the musculoskeletal system     Problem List Patient Active Problem List   Diagnosis Date Noted  . Acute medial meniscus tear, right, subsequent encounter   . VERTIGO 09/06/2009  . FATIGUE 09/06/2009  . OBESITY, MILD 01/07/2008  . Abdominal pain, other specified site 01/07/2008        Geraldine Solar PT, DPT  [facility] [address] [place], [place], [address] Phone: [phone]   Fax:  [phone]  Name: Deborah Kennedy MRN: [id number] Date of Birth: May 17, 1961

## 2017-08-22 ENCOUNTER — Telehealth (HOSPITAL_COMMUNITY): Payer: Self-pay | Admitting: Family Medicine

## 2017-08-22 ENCOUNTER — Ambulatory Visit (HOSPITAL_COMMUNITY): Payer: 59 | Admitting: Physical Therapy

## 2017-08-22 DIAGNOSIS — S83281D Other tear of lateral meniscus, current injury, right knee, subsequent encounter: Secondary | ICD-10-CM | POA: Diagnosis not present

## 2017-08-22 DIAGNOSIS — S83241D Other tear of medial meniscus, current injury, right knee, subsequent encounter: Secondary | ICD-10-CM | POA: Diagnosis not present

## 2017-08-22 NOTE — Telephone Encounter (Signed)
08/22/17  pt has another appt at the same time today and we tried to RS and offered her 3:15 but she said she couldn't do because she has to pick up her child

## 2017-08-23 ENCOUNTER — Ambulatory Visit (HOSPITAL_COMMUNITY): Payer: 59

## 2017-08-23 ENCOUNTER — Encounter (HOSPITAL_COMMUNITY): Payer: Self-pay

## 2017-08-23 DIAGNOSIS — R29898 Other symptoms and signs involving the musculoskeletal system: Secondary | ICD-10-CM | POA: Diagnosis not present

## 2017-08-23 DIAGNOSIS — M6281 Muscle weakness (generalized): Secondary | ICD-10-CM

## 2017-08-23 DIAGNOSIS — M25561 Pain in right knee: Secondary | ICD-10-CM | POA: Diagnosis not present

## 2017-08-23 DIAGNOSIS — R262 Difficulty in walking, not elsewhere classified: Secondary | ICD-10-CM

## 2017-08-23 NOTE — Therapy (Signed)
[place], [place], [address] Phone: [phone]   Fax:  [phone]  Physical Therapy Treatment  Patient Details  Name: Deborah Kennedy MRN: [id number] Date of Birth: 1960-12-29 Referring Provider: Dr. Noemi Chapel  Encounter Date: 08/23/2017      PT End of Session - 08/23/17 1344    Visit Number 3   Number of Visits 13   Date for PT Re-Evaluation 08/29/17   Authorization Type [facility] [employer]   Authorization Time Period 08/08/17 to 09/19/17   Authorization - Visit Number 3   Authorization - Number of Visits 24   PT Start Time 6767   PT Stop Time 1341   PT Time Calculation (min) 39 min   Activity Tolerance Patient tolerated treatment well   Behavior During Therapy [facility] for tasks assessed/performed      Past Medical History:  Diagnosis Date  . Acute medial meniscus tear, right, subsequent encounter   . Anxiety   . Complication of anesthesia    reaction to epidural medication  . Depression     Past Surgical History:  Procedure Laterality Date  . ABDOMINAL HYSTERECTOMY  10/18/2011   Procedure: HYSTERECTOMY ABDOMINAL;  Surgeon: Jonnie Kind, MD;  Location: [facility];  Service: Gynecology;  Laterality: N/A;  . CERVICAL FUSION  2008  . KNEE ARTHROSCOPY WITH MEDIAL MENISECTOMY Right 06/14/2017   Procedure: RIGHT KNEE ARTHROSCOPY WITH MEDIAL MENISECTOMY;  Surgeon: Elsie Saas, MD;  Location: [facility];  Service: Orthopedics;  Laterality: Right;  . TUBAL LIGATION      There were no vitals filed for this visit.      Subjective Assessment - 08/23/17 1304    Subjective Pt states that her knee is not doing good. She is having constant pain in the knee. She has been taking more medicine for her pain. It's hurting her so bad that she has been using her crutch more. She saw her MD and they did an x-ray, which was negative. She is scheduled for an MRI on 08/29/17 to r/o stress fracture.    Limitations Walking   How long can you sit comfortably? no issues   How long can you stand comfortably? no issues since the cyst aspiration   How long can you walk comfortably? around the house but limping   Patient Stated Goals get back to regular routine   Currently in Pain? Yes   Pain Score 8    Pain Location Knee   Pain Orientation Right;Medial   Pain Descriptors / Indicators Aching   Pain Type Acute pain   Pain Onset More than a month ago   Pain Frequency Constant   Aggravating Factors  walking, standing   Pain Relieving Factors heat, Ben-Gay   Effect of Pain on Daily Activities increases             [facility] PT Assessment - 08/23/17 0001      Observation/Other Assessments-Edema    Edema Circumferential     Circumferential Edema   Circumferential - Right Joint line: 19"; 3" suprapatellar: 22.7"   Circumferential - Left  Joint line: 19"; 3" suprapatellar: 24"           [facility] Adult PT Treatment/Exercise - 08/23/17 0001      Knee/Hip Exercises: Seated   Long Arc Quad Right;15 reps     Knee/Hip Exercises: Supine   Short Arc [employer] Right;15 reps   Short Arc [employer] Limitations 2-3" holds   [employer]  Limitations x15 reps   Straight Leg Raises Right;15 reps   Straight Leg Raises Limitations cues for decreasing lag   Other Supine Knee/Hip Exercises clams with RTB x15 reps     Knee/Hip Exercises: Sidelying   Hip ABduction Right;15 reps   Hip ADduction Right;15 reps     Knee/Hip Exercises: Prone   Hamstring Curl 15 reps   Hamstring Curl Limitations RLE   Hip Extension Right;15 reps     Manual Therapy   Manual Therapy Soft tissue mobilization   Manual therapy comments completed separate rest of treatment   Soft tissue mobilization STM medial R knee for pain management               PT Short Term Goals - 08/08/17 1621      PT SHORT TERM GOAL #1   Title Pt will be independent with HEP and perform consistenly in order to promote return to PLOF.    Time 3   Period Weeks   Status New   Target Date 08/29/17     PT SHORT TERM GOAL #2   Title Pt will have improved HS MMT to 5/5 and improved medial and lateral HS MMT to at least 4/5 to decrease pain and maximize giat.   Time 3   Period Weeks   Status New           PT Long Term Goals - 08/08/17 1624      PT LONG TERM GOAL #1   Title Pt will have improved 6MWT to at least 1241ft to demo decreased R knee pain and improved tolerance to ambulation.    Time 6   Period Weeks   Status New   Target Date 09/19/17     PT LONG TERM GOAL #2   Title Pt will have improved SLS on RLE to at least 20 sec to be symmetrical with the LLE in order to maximize gait.   Time 6   Period Weeks   Status New     PT LONG TERM GOAL #3   Title Pt will report walking at least 3x/week with 2/10 R knee pain or less to demo improved overall function and tolerance to movement and promote return to her regular walking program.   Time 6   Period Weeks   Status New     PT LONG TERM GOAL #4   Title Pt will be able to ascend/descend a flight of stairs in a reciprocal manner with 1 or no handrail, good eccentric control throughout, and without R knee pain to demo improved functional strength of her RLE and maximize community access.    Time 6   Period Weeks   Status New               Plan - 08/23/17 1344    Clinical Impression Statement Pt presented to [facility] with increased reports of R knee pain. Suprapatellar swelling measured and pt noted to be 22.7" on the R and 24" on the L, likely indicating quad atrophy on the R. Began session with STM to medial knee for pain management. Ended session with NWB strengthening of RLE with no reports of pain following. Pt reports improved symptoms at EOS.    Rehab Potential Good   PT Frequency 2x / week   PT Duration 6 weeks   PT Treatment/Interventions ADLs/Self Care Home Management;Cryotherapy;Electrical Stimulation;Moist Heat;Traction;DME Instruction;Gait  training;Stair training;Functional mobility training;Therapeutic activities;Therapeutic exercise;Balance training;Neuromuscular re-education;Patient/family education;Manual techniques;Passive range of motion;Dry needling;Taping   PT Next Visit  Plan continue manual to R knee for pain management; continue and progress NWB strengthening as tolerated   PT Home Exercise Plan eval: SLS; 10/26: supine clams   Consulted and Agree with Plan of Care Patient      Patient will benefit from skilled therapeutic intervention in order to improve the following deficits and impairments:  Abnormal gait, Decreased activity tolerance, Decreased balance, Decreased mobility, Decreased strength, Difficulty walking, Increased fascial restricitons, Increased muscle spasms, Pain  Visit Diagnosis: Acute pain of right knee  Muscle weakness (generalized)  Difficulty in walking, not elsewhere classified  Other symptoms and signs involving the musculoskeletal system     Problem List Patient Active Problem List   Diagnosis Date Noted  . Acute medial meniscus tear, right, subsequent encounter   . VERTIGO 09/06/2009  . FATIGUE 09/06/2009  . OBESITY, MILD 01/07/2008  . Abdominal pain, other specified site 01/07/2008       Geraldine Solar PT, DPT  [facility] [address] [place], [place], [address] Phone: [phone]   Fax:  [phone]  Name: Deborah Kennedy MRN: [id number] Date of Birth: Sep 11, 1961

## 2017-08-26 ENCOUNTER — Ambulatory Visit (HOSPITAL_COMMUNITY): Payer: 59

## 2017-08-26 DIAGNOSIS — M25561 Pain in right knee: Secondary | ICD-10-CM

## 2017-08-26 DIAGNOSIS — R262 Difficulty in walking, not elsewhere classified: Secondary | ICD-10-CM

## 2017-08-26 DIAGNOSIS — R29898 Other symptoms and signs involving the musculoskeletal system: Secondary | ICD-10-CM

## 2017-08-26 DIAGNOSIS — M6281 Muscle weakness (generalized): Secondary | ICD-10-CM

## 2017-08-26 NOTE — Therapy (Signed)
[place], [place], [address] Phone: [phone]   Fax:  [phone]  Physical Therapy Treatment  Patient Details  Name: Deborah Kennedy MRN: [id number] Date of Birth: 1961/07/21 Referring Provider: Dr. Noemi Chapel  Encounter Date: 08/26/2017      PT End of Session - 08/26/17 1123    Visit Number 4   Number of Visits 13   Date for PT Re-Evaluation 08/29/17   Authorization Type [employer]   Authorization Time Period 08/08/17 to 09/19/17   Authorization - Visit Number 4   Authorization - Number of Visits 24   PT Start Time 1120   PT Stop Time 1200   PT Time Calculation (min) 40 min   Activity Tolerance Patient tolerated treatment well   Behavior During Therapy [facility] for tasks assessed/performed      Past Medical History:  Diagnosis Date  . Acute medial meniscus tear, right, subsequent encounter   . Anxiety   . Complication of anesthesia    reaction to epidural medication  . Depression     Past Surgical History:  Procedure Laterality Date  . ABDOMINAL HYSTERECTOMY  10/18/2011   Procedure: HYSTERECTOMY ABDOMINAL;  Surgeon: Jonnie Kind, MD;  Location: [facility];  Service: Gynecology;  Laterality: N/A;  . CERVICAL FUSION  2008  . KNEE ARTHROSCOPY WITH MEDIAL MENISECTOMY Right 06/14/2017   Procedure: RIGHT KNEE ARTHROSCOPY WITH MEDIAL MENISECTOMY;  Surgeon: Elsie Saas, MD;  Location: [facility];  Service: Orthopedics;  Laterality: Right;  . TUBAL LIGATION      There were no vitals filed for this visit.      Subjective Assessment - 08/26/17 1123    Subjective Pt reports that her knee is feeling okay today. She reports that her leg felt better following last session.   Limitations Walking   How long can you sit comfortably? no issues   How long can you stand comfortably? no issues since the cyst aspiration   How long can you walk comfortably? around the house but limping   Patient Stated Goals get back to regular routine   Currently in Pain? Yes   Pain Score 6    Pain Location Knee   Pain Orientation Right;Medial   Pain Descriptors / Indicators Aching   Pain Type Acute pain   Pain Onset More than a month ago   Pain Frequency Constant   Aggravating Factors  walking, standing   Pain Relieving Factors heat, Ben-Gay   Effect of Pain on Daily Activities increases               [facility] Adult PT Treatment/Exercise - 08/26/17 0001      Knee/Hip Exercises: Machines for Strengthening   Cybex Knee Extension 2 plates x20 reps   Cybex Knee Flexion 2 plates x20 reps     Knee/Hip Exercises: Standing   Hip Abduction Both;2 sets;10 reps   Abduction Limitations RTB   Hip Extension Both;2 sets;10 reps   Extension Limitations RTB     Knee/Hip Exercises: Supine   Short Arc Quad Sets Right;20 reps   Short Arc Quad Sets Limitations 2#, 2-3" holds   [employer] Limitations 20 reps   Straight Leg Raises Right;20 reps   Straight Leg Raises Limitations 2#     Knee/Hip Exercises: Sidelying   Hip ABduction Right;20 reps   Hip ABduction Limitations 2#   Hip ADduction Right;20 reps   Hip ADduction Limitations 2#     Knee/Hip Exercises: Prone  Hip Extension Right;20 reps   Hip Extension Limitations 2#     Manual Therapy   Manual Therapy Soft tissue mobilization   Manual therapy comments completed separate rest of treatment   Soft tissue mobilization STM medial R knee for pain management               PT Education - 08/26/17 1201    Education provided Yes   Education Details updated HEP to include 4-way SLR, standing hip abd   Person(s) Educated Patient   Methods Explanation;Demonstration;Handout   Comprehension Verbalized understanding;Returned demonstration          PT Short Term Goals - 08/08/17 1621      PT SHORT TERM GOAL #1   Title Pt will be independent with HEP and perform consistenly in order to promote return to PLOF.   Time 3    Period Weeks   Status New   Target Date 08/29/17     PT SHORT TERM GOAL #2   Title Pt will have improved HS MMT to 5/5 and improved medial and lateral HS MMT to at least 4/5 to decrease pain and maximize giat.   Time 3   Period Weeks   Status New           PT Long Term Goals - 08/08/17 1624      PT LONG TERM GOAL #1   Title Pt will have improved 6MWT to at least 1248ft to demo decreased R knee pain and improved tolerance to ambulation.    Time 6   Period Weeks   Status New   Target Date 09/19/17     PT LONG TERM GOAL #2   Title Pt will have improved SLS on RLE to at least 20 sec to be symmetrical with the LLE in order to maximize gait.   Time 6   Period Weeks   Status New     PT LONG TERM GOAL #3   Title Pt will report walking at least 3x/week with 2/10 R knee pain or less to demo improved overall function and tolerance to movement and promote return to her regular walking program.   Time 6   Period Weeks   Status New     PT LONG TERM GOAL #4   Title Pt will be able to ascend/descend a flight of stairs in a reciprocal manner with 1 or no handrail, good eccentric control throughout, and without R knee pain to demo improved functional strength of her RLE and maximize community access.    Time 6   Period Weeks   Status New               Plan - 08/26/17 1206    Clinical Impression Statement Pt with slightly improved symptoms this date. Began with manual for pain management to which pt tolerated well. Progressed NWB/low impact strengthening this date and pt did very well with it, denying any pain during. Continue POC as planned.   Rehab Potential Good   PT Frequency 2x / week   PT Duration 6 weeks   PT Treatment/Interventions ADLs/Self Care Home Management;Cryotherapy;Electrical Stimulation;Moist Heat;Traction;DME Instruction;Gait training;Stair training;Functional mobility training;Therapeutic activities;Therapeutic exercise;Balance training;Neuromuscular  re-education;Patient/family education;Manual techniques;Passive range of motion;Dry needling;Taping   PT Next Visit Plan continue manual to R knee for pain management; continue and progress NWB strengthening as tolerated; add cybex leg press, SLS with vectors   PT Home Exercise Plan eval: SLS; 10/26: supine clams; 10/29: standing hip abd, 4-way SLR   Consulted  and Agree with Plan of Care Patient      Patient will benefit from skilled therapeutic intervention in order to improve the following deficits and impairments:  Abnormal gait, Decreased activity tolerance, Decreased balance, Decreased mobility, Decreased strength, Difficulty walking, Increased fascial restricitons, Increased muscle spasms, Pain  Visit Diagnosis: Acute pain of right knee  Muscle weakness (generalized)  Difficulty in walking, not elsewhere classified  Other symptoms and signs involving the musculoskeletal system     Problem List Patient Active Problem List   Diagnosis Date Noted  . Acute medial meniscus tear, right, subsequent encounter   . VERTIGO 09/06/2009  . FATIGUE 09/06/2009  . OBESITY, MILD 01/07/2008  . Abdominal pain, other specified site 01/07/2008       Geraldine Solar PT, DPT  [facility] [address] [place], [place], [address] Phone: [phone]   Fax:  [phone]  Name: Deborah Kennedy MRN: [id number] Date of Birth: 01/10/1961

## 2017-08-26 NOTE — Patient Instructions (Addendum)
  LOOPED ELASTIC BAND HIP ABDUCTION  While standing with an elastic band looped around your ankles, move the target leg out to the side as shown.   Perform 1x/day, 2-3 sets of 10-15 with red band    Perform 1x/day, 2-3 sets of 10-15

## 2017-08-28 ENCOUNTER — Ambulatory Visit (HOSPITAL_COMMUNITY): Payer: 59

## 2017-08-28 ENCOUNTER — Encounter (HOSPITAL_COMMUNITY): Payer: Self-pay

## 2017-08-28 DIAGNOSIS — M6281 Muscle weakness (generalized): Secondary | ICD-10-CM | POA: Diagnosis not present

## 2017-08-28 DIAGNOSIS — R29898 Other symptoms and signs involving the musculoskeletal system: Secondary | ICD-10-CM

## 2017-08-28 DIAGNOSIS — R262 Difficulty in walking, not elsewhere classified: Secondary | ICD-10-CM | POA: Diagnosis not present

## 2017-08-28 DIAGNOSIS — M25561 Pain in right knee: Secondary | ICD-10-CM | POA: Diagnosis not present

## 2017-08-28 NOTE — Therapy (Signed)
[place], [place], [address] Phone: [phone]   Fax:  [phone]  Physical Therapy Treatment  Patient Details  Name: Deborah Kennedy MRN: [id number] Date of Birth: Oct 01, 1961 Referring Provider: Dr. Noemi Chapel  Encounter Date: 08/28/2017      PT End of Session - 08/28/17 1122    Visit Number 5   Number of Visits 13   Date for PT Re-Evaluation 08/29/17   Authorization Type [facility]   Authorization Time Period 08/08/17 to 09/19/17   Authorization - Visit Number 5   Authorization - Number of Visits 24   PT Start Time 1119   PT Stop Time 1157   PT Time Calculation (min) 38 min   Activity Tolerance Patient tolerated treatment well   Behavior During Therapy [facility] for tasks assessed/performed      Past Medical History:  Diagnosis Date  . Acute medial meniscus tear, right, subsequent encounter   . Anxiety   . Complication of anesthesia    reaction to epidural medication  . Depression     Past Surgical History:  Procedure Laterality Date  . ABDOMINAL HYSTERECTOMY  10/18/2011   Procedure: HYSTERECTOMY ABDOMINAL;  Surgeon: Jonnie Kind, MD;  Location: [facility];  Service: Gynecology;  Laterality: N/A;  . CERVICAL FUSION  2008  . KNEE ARTHROSCOPY WITH MEDIAL MENISECTOMY Right 06/14/2017   Procedure: RIGHT KNEE ARTHROSCOPY WITH MEDIAL MENISECTOMY;  Surgeon: Elsie Saas, MD;  Location: [facility];  Service: Orthopedics;  Laterality: Right;  . TUBAL LIGATION      There were no vitals filed for this visit.      Subjective Assessment - 08/28/17 1126    Subjective Pt reports her knee is feeling " so so", pain scale 4/10.   Patient Stated Goals get back to regular routine   Currently in Pain? Yes   Pain Score 4    Pain Location Knee   Pain Orientation Right   Pain Descriptors / Indicators Aching;Dull   Pain Type Acute pain   Pain Onset More than a month ago   Pain Frequency  Constant   Aggravating Factors  walking, standing   Pain Relieving Factors heat, Ben-gay, tylonel   Effect of Pain on Daily Activities increases            [facility] PT Assessment - 08/28/17 0001      Assessment   Medical Diagnosis s/p R knee scope and OA debridement; L/S R leg radiculopathy   Referring Provider Dr. Noemi Chapel   Onset Date/Surgical Date 06/14/17   Next MD Visit --  MRI 08/29/2017              [place] [facility] PT Treatment/Exercise - 08/28/17 0001      Knee/Hip Exercises: Machines for Strengthening   Cybex Knee Extension 2 plates x20 reps   Cybex Knee Flexion 2 plates x20 reps   Cybex Leg Press 4PL 15x     Knee/Hip Exercises: Standing   SLS with Vectors 3x5" with intermittent HHA     Knee/Hip Exercises: Supine   Short Arc Quad Sets Right;15 reps   Short Arc Quad Sets Limitations 2# 5" holds   Straight Leg Raises Right;20 reps   Straight Leg Raises Limitations 2#     Knee/Hip Exercises: Sidelying   Hip ABduction Right;20 reps   Hip ABduction Limitations 2#   Hip ADduction Right;20 reps   Hip ADduction Limitations 2#     Knee/Hip Exercises: Prone  Hamstring Curl 20 reps   Hamstring Curl Limitations 2#   Hip Extension Right;20 reps   Hip Extension Limitations 2#             PT Short Term Goals - 08/08/17 1621      PT SHORT TERM GOAL #1   Title Pt will be independent with HEP and perform consistenly in order to promote return to PLOF.   Time 3   Period Weeks   Status New   Target Date 08/29/17     PT SHORT TERM GOAL #2   Title Pt will have improved HS MMT to 5/5 and improved medial and lateral HS MMT to at least 4/5 to decrease pain and maximize giat.   Time 3   Period Weeks   Status New           PT Long Term Goals - 08/08/17 1624      PT LONG TERM GOAL #1   Title Pt will have improved 6MWT to at least 1232ft to demo decreased R knee pain and improved tolerance to ambulation.    Time 6   Period Weeks   Status New   Target Date  09/19/17     PT LONG TERM GOAL #2   Title Pt will have improved SLS on RLE to at least 20 sec to be symmetrical with the LLE in order to maximize gait.   Time 6   Period Weeks   Status New     PT LONG TERM GOAL #3   Title Pt will report walking at least 3x/week with 2/10 R knee pain or less to demo improved overall function and tolerance to movement and promote return to her regular walking program.   Time 6   Period Weeks   Status New     PT LONG TERM GOAL #4   Title Pt will be able to ascend/descend a flight of stairs in a reciprocal manner with 1 or no handrail, good eccentric control throughout, and without R knee pain to demo improved functional strength of her RLE and maximize community access.    Time 6   Period Weeks   Status New               Plan - 08/28/17 1225    Clinical Impression Statement Continued session foucs with low impact LE strenghtening and manual technqiues to address pain management.  Added vector stance for hip stability and leg press for strengthening.  Pt tolerated well towards therex with no report of increased pain through session.  Pt reports most relief with soft tissue mobilization to medial aspect of knee.     Rehab Potential Good   PT Frequency 2x / week   PT Duration 6 weeks   PT Treatment/Interventions ADLs/Self Care Home Management;Cryotherapy;Electrical Stimulation;Moist Heat;Traction;DME Instruction;Gait training;Stair training;Functional mobility training;Therapeutic activities;Therapeutic exercise;Balance training;Neuromuscular re-education;Patient/family education;Manual techniques;Passive range of motion;Dry needling;Taping   PT Next Visit Plan Reassess next session.  F/U with MRI results.  Continue wiht PT POC with manual for Rt knee pain management and progress strengthening with low impact activities.    PT Home Exercise Plan eval: SLS; 10/26: supine clams; 10/29: standing hip abd, 4-way SLR      Patient will benefit from skilled  therapeutic intervention in order to improve the following deficits and impairments:  Abnormal gait, Decreased activity tolerance, Decreased balance, Decreased mobility, Decreased strength, Difficulty walking, Increased fascial restricitons, Increased muscle spasms, Pain  Visit Diagnosis: Acute pain of right knee  Muscle  weakness (generalized)  Difficulty in walking, not elsewhere classified  Other symptoms and signs involving the musculoskeletal system     Problem List Patient Active Problem List   Diagnosis Date Noted  . Acute medial meniscus tear, right, subsequent encounter   . VERTIGO 09/06/2009  . FATIGUE 09/06/2009  . OBESITY, MILD 01/07/2008  . Abdominal pain, other specified site 01/07/2008   Ihor Austin, [place]; [place]  Aldona Lento 08/28/2017, 12:31 PM  [place], [place], [address] Phone: [phone]   Fax:  [phone]  Name: Shenoa Hattabaugh MRN: [id number] Date of Birth: 1960/12/20

## 2017-08-29 DIAGNOSIS — M25561 Pain in right knee: Secondary | ICD-10-CM | POA: Diagnosis not present

## 2017-08-29 DIAGNOSIS — F4323 Adjustment disorder with mixed anxiety and depressed mood: Secondary | ICD-10-CM | POA: Diagnosis not present

## 2017-09-03 ENCOUNTER — Telehealth (HOSPITAL_COMMUNITY): Payer: Self-pay | Admitting: Family Medicine

## 2017-09-03 ENCOUNTER — Ambulatory Visit (HOSPITAL_COMMUNITY): Payer: 59

## 2017-09-03 NOTE — Telephone Encounter (Signed)
09/03/17  She said she wanted to cx all future appts because she got her MRI results and won't be back right now... I asked if she wanted to be discharged and she said no

## 2017-09-05 ENCOUNTER — Encounter (HOSPITAL_COMMUNITY): Payer: 59

## 2017-09-10 ENCOUNTER — Encounter (HOSPITAL_COMMUNITY): Payer: 59

## 2017-09-11 DIAGNOSIS — F4323 Adjustment disorder with mixed anxiety and depressed mood: Secondary | ICD-10-CM | POA: Diagnosis not present

## 2017-09-12 ENCOUNTER — Encounter (HOSPITAL_COMMUNITY): Payer: 59

## 2017-09-17 ENCOUNTER — Encounter (HOSPITAL_COMMUNITY): Payer: 59

## 2017-09-24 ENCOUNTER — Encounter (HOSPITAL_COMMUNITY): Payer: Self-pay

## 2017-09-24 DIAGNOSIS — M1711 Unilateral primary osteoarthritis, right knee: Secondary | ICD-10-CM | POA: Diagnosis not present

## 2017-09-24 DIAGNOSIS — M25562 Pain in left knee: Secondary | ICD-10-CM | POA: Diagnosis not present

## 2017-11-13 DIAGNOSIS — M25562 Pain in left knee: Secondary | ICD-10-CM | POA: Diagnosis not present

## 2017-11-21 IMAGING — MR MR KNEE*R* W/O CM
4 of 6 series · 21 of 40 positions shown · non-contrast
Comparison: Radiographs dated 02/09/2014

CLINICAL DATA: Right knee pain and swelling.

EXAM:
MRI OF THE RIGHT KNEE WITHOUT CONTRAST
TECHNIQUE: Multiplanar, multisequence MR imaging of the knee was performed. No
intravenous contrast was administered.

[Series 3: PD fat-sat · axial · 3.0mm · 0.39mm/px · z∈[-70,+66]mm · 9 of 40 slices shown (1 of 4)]
[im 1/40]
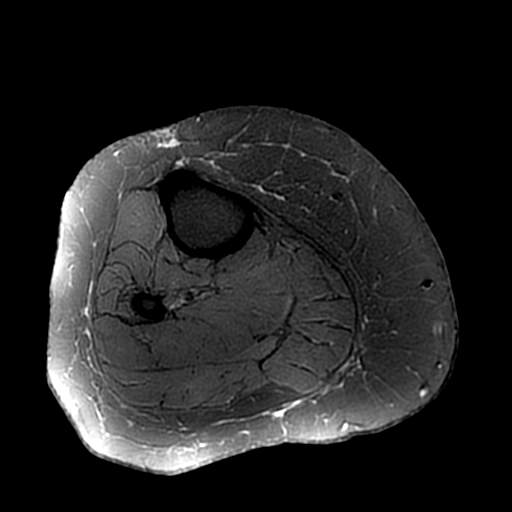
[im 5/40]
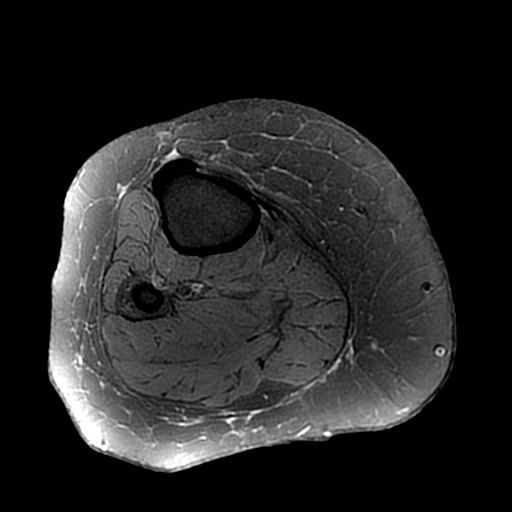
[im 10/40]
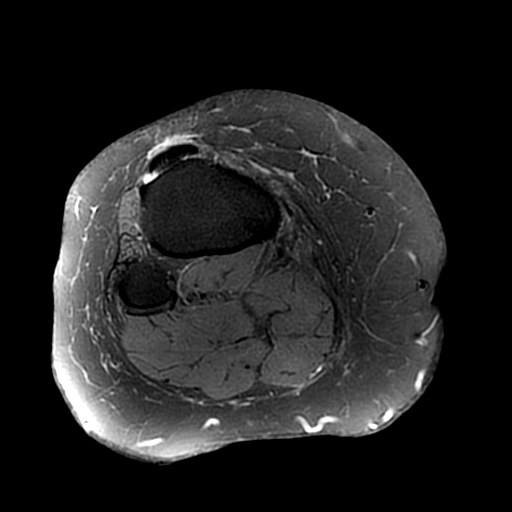
[im 15/40]
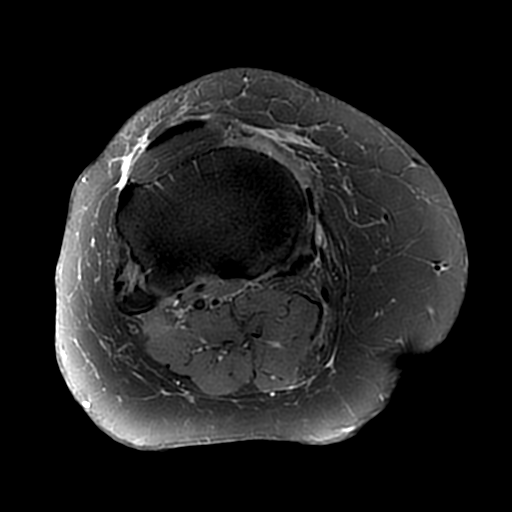
[im 20/40]
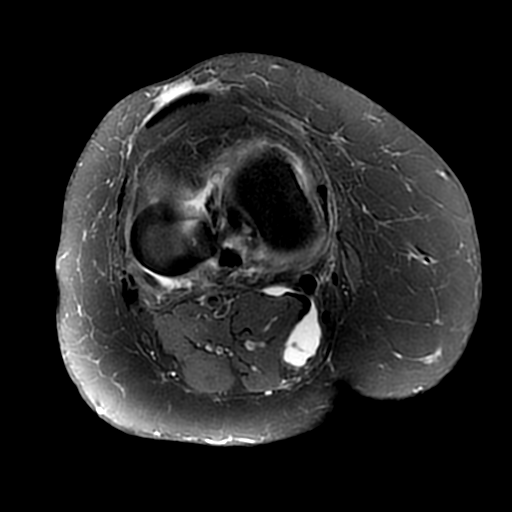
[im 25/40]
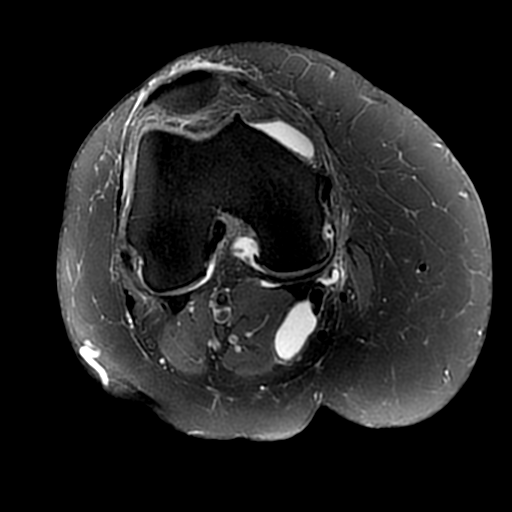
[im 30/40]
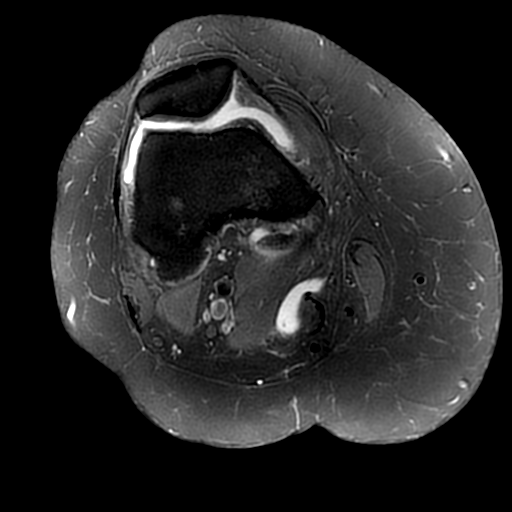
[im 35/40]
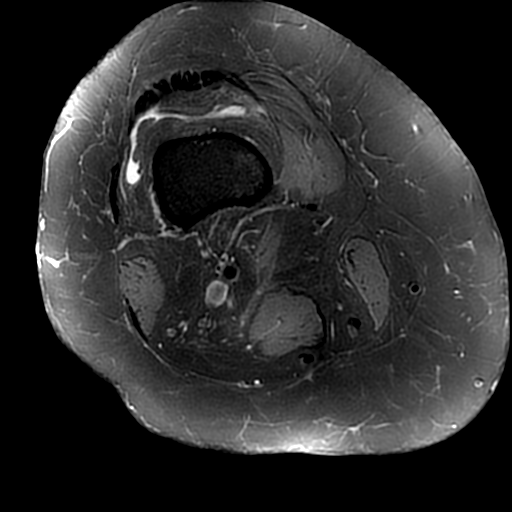
[im 40/40]
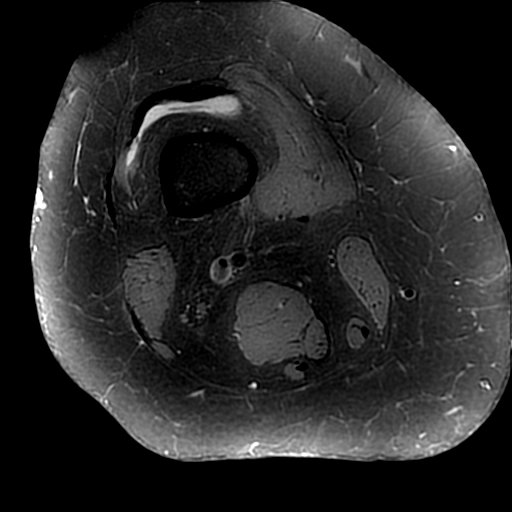

[Series 6: PD fat-sat · coronal · 3.0mm · 0.37mm/px · 6 of 36 slices shown (2 of 4)]
[im 1/36]
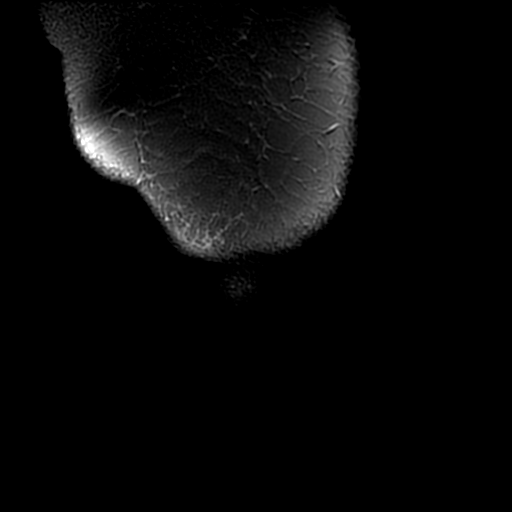
[im 6/36]
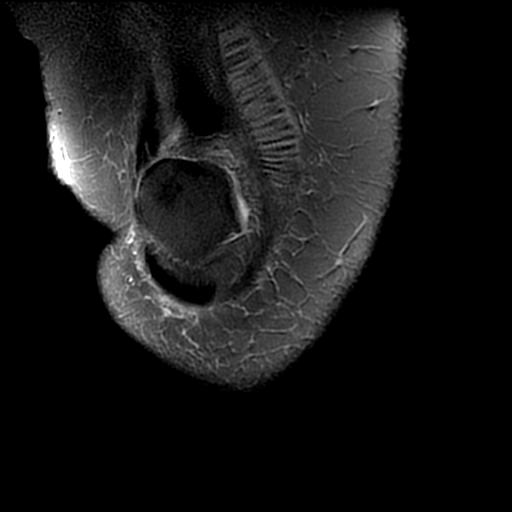
[im 12/36]
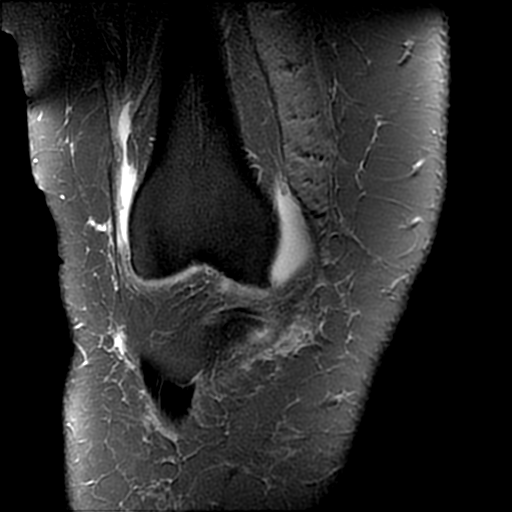
[im 18/36]
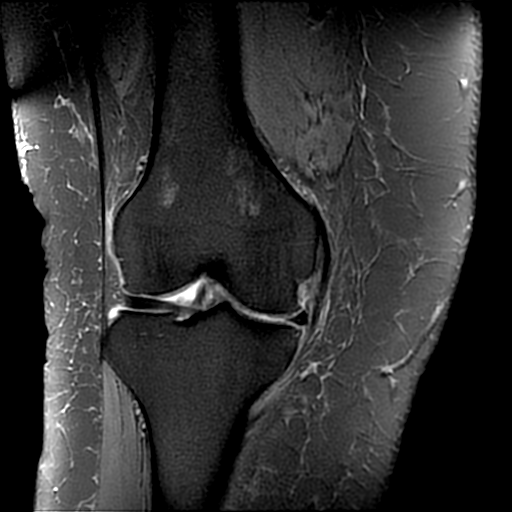
[im 24/36]
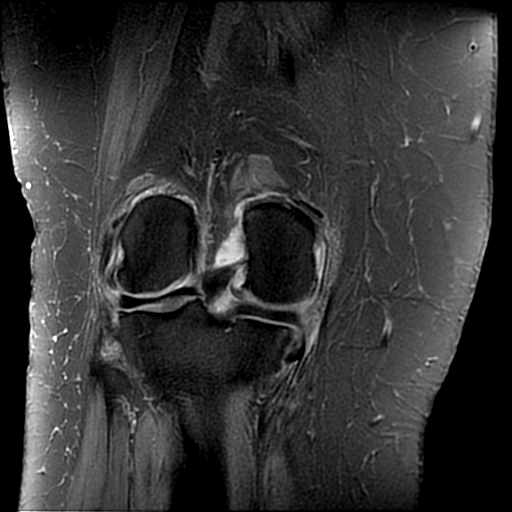
[im 30/36]
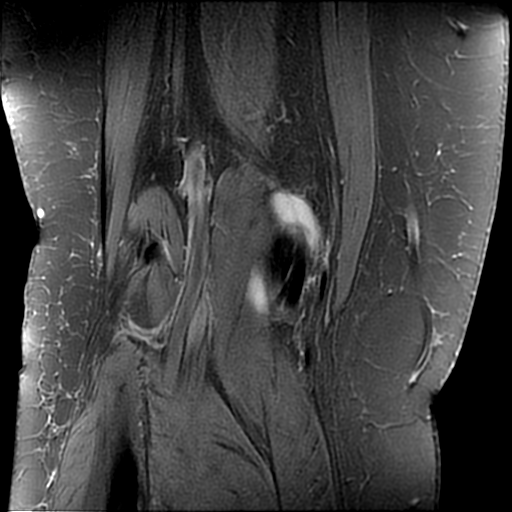

[Series 7: PD fat-sat · sagittal · 3.0mm · 0.35mm/px · 3 of 34 slices shown (3 of 4)]
[im 6/34]
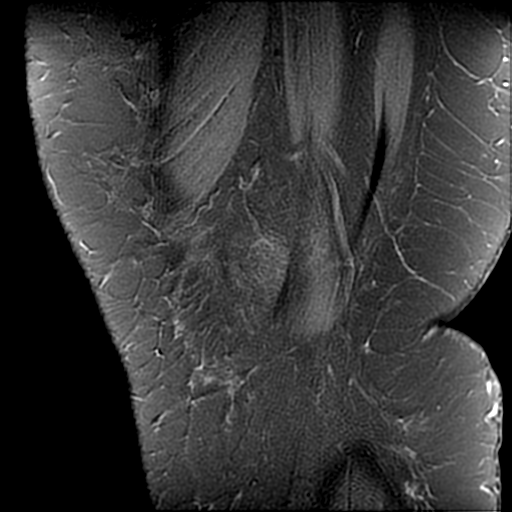
[im 17/34]
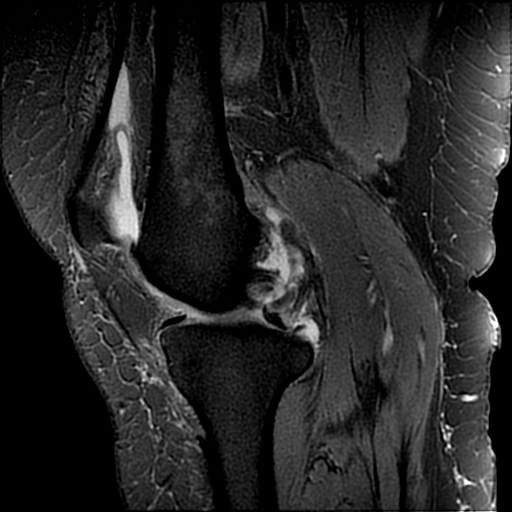
[im 28/34]
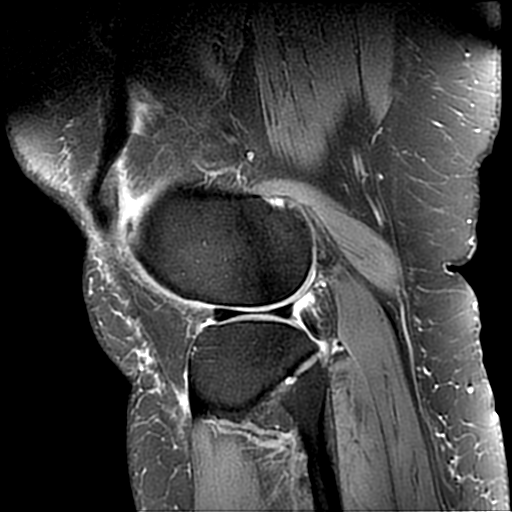

[Series 8: PD fat-sat · coronal · 2.0mm · 0.70mm/px · 3 of 15 slices shown (4 of 4)]
[im 1/15]
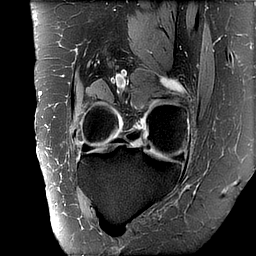
[im 8/15]
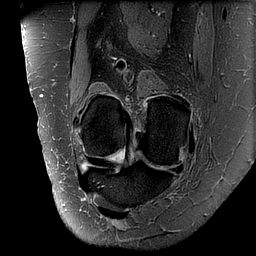
[im 15/15]
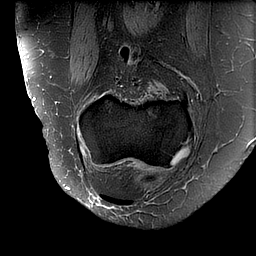

[21 of 40 positions shown; findings below may reference images not displayed]

FINDINGS: MENISCI

Medial meniscus: There is a displaced Grammatikaki tear of the
posterior horn near the meniscal root. The torn component is flipped
above the meniscal root. There appears to be a tiny fragment of
meniscus lying posterior to the posterior horn on image 18 of series
7.

Lateral meniscus:  Partial discoid meniscus.

LIGAMENTS

Cruciates:  Intact ACL and PCL.

Collaterals: Medial collateral ligament is intact. Lateral
collateral ligament complex is intact.

CARTILAGE

Patellofemoral: Small focal area of grade 4 chondromalacia of the
trochlear groove of the distal femur.

Medial:  Slight irregular thinning of the articular cartilage.

Lateral:  Normal.

Joint:  Small joint effusion.

Popliteal Fossa:  Small Baker's cyst.

Extensor Mechanism:  Normal.

Bones: Small marginal osteophytes in the medial and patellofemoral
compartments.

Other: None
IMPRESSION: 1. Displaced Grammatikaki tear of the posterior horn of the medial
meniscus as described.
2. Small joint effusion with small Baker's cyst.
3. Slight chondromalacia of the patellofemoral and medial
compartments.

## 2017-11-25 ENCOUNTER — Telehealth: Payer: Self-pay | Admitting: Obstetrics and Gynecology

## 2017-11-25 NOTE — Telephone Encounter (Signed)
Patient called stating that she was told by dr. Glo Herring to give [place] a call and speak with him regarding her daughter. Please contact pt

## 2017-11-25 NOTE — Telephone Encounter (Signed)
Unable to reach, will try again later. Left message for pt.

## 2018-01-06 DIAGNOSIS — M170 Bilateral primary osteoarthritis of knee: Secondary | ICD-10-CM | POA: Diagnosis not present

## 2018-01-06 DIAGNOSIS — A64 Unspecified sexually transmitted disease: Secondary | ICD-10-CM | POA: Diagnosis not present

## 2018-01-06 DIAGNOSIS — R159 Full incontinence of feces: Secondary | ICD-10-CM | POA: Diagnosis not present

## 2018-01-06 DIAGNOSIS — I10 Essential (primary) hypertension: Secondary | ICD-10-CM | POA: Diagnosis not present

## 2018-01-06 DIAGNOSIS — R109 Unspecified abdominal pain: Secondary | ICD-10-CM | POA: Diagnosis not present

## 2018-01-06 DIAGNOSIS — F413 Other mixed anxiety disorders: Secondary | ICD-10-CM | POA: Diagnosis not present

## 2018-01-07 DIAGNOSIS — R030 Elevated blood-pressure reading, without diagnosis of hypertension: Secondary | ICD-10-CM | POA: Diagnosis not present

## 2018-01-07 DIAGNOSIS — Z6839 Body mass index (BMI) 39.0-39.9, adult: Secondary | ICD-10-CM | POA: Diagnosis not present

## 2018-01-07 DIAGNOSIS — M545 Low back pain: Secondary | ICD-10-CM | POA: Diagnosis not present

## 2018-01-15 DIAGNOSIS — M47816 Spondylosis without myelopathy or radiculopathy, lumbar region: Secondary | ICD-10-CM | POA: Diagnosis not present

## 2018-01-15 DIAGNOSIS — M5136 Other intervertebral disc degeneration, lumbar region: Secondary | ICD-10-CM | POA: Diagnosis not present

## 2018-01-15 DIAGNOSIS — M545 Low back pain: Secondary | ICD-10-CM | POA: Diagnosis not present

## 2018-01-20 DIAGNOSIS — M130 Polyarthritis, unspecified: Secondary | ICD-10-CM | POA: Diagnosis not present

## 2018-01-20 DIAGNOSIS — I10 Essential (primary) hypertension: Secondary | ICD-10-CM | POA: Diagnosis not present

## 2018-01-20 DIAGNOSIS — M4316 Spondylolisthesis, lumbar region: Secondary | ICD-10-CM | POA: Diagnosis not present

## 2018-01-20 DIAGNOSIS — N3281 Overactive bladder: Secondary | ICD-10-CM | POA: Diagnosis not present

## 2018-04-09 DIAGNOSIS — H5213 Myopia, bilateral: Secondary | ICD-10-CM | POA: Diagnosis not present

## 2018-05-05 DIAGNOSIS — R079 Chest pain, unspecified: Secondary | ICD-10-CM | POA: Diagnosis not present

## 2018-05-05 DIAGNOSIS — N3281 Overactive bladder: Secondary | ICD-10-CM | POA: Diagnosis not present

## 2018-05-05 DIAGNOSIS — I10 Essential (primary) hypertension: Secondary | ICD-10-CM | POA: Diagnosis not present

## 2018-07-14 ENCOUNTER — Telehealth: Payer: Self-pay | Admitting: Obstetrics and Gynecology

## 2018-07-14 NOTE — Telephone Encounter (Signed)
Pt called said she was returning a call to Dr Glo Herring, Carmell Austria was in a room with a pt so she asked for me to take a message for him to call

## 2018-07-14 NOTE — Telephone Encounter (Signed)
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

## 2018-07-24 ENCOUNTER — Encounter (HOSPITAL_COMMUNITY): Payer: Self-pay

## 2018-07-24 NOTE — Therapy (Signed)
[place], [place], [address] Phone: [phone]   Fax:  [phone]  Patient Details  Name: Kimyah Frein MRN: [id number] Date of Birth: 07-Feb-1961 Referring Provider:  No ref. provider found  Encounter Date: 07/24/2018   PHYSICAL THERAPY DISCHARGE SUMMARY  Visits from Start of Care: 5  Current functional level related to goals / functional outcomes: See last note   Remaining deficits: See last note   Education / Equipment: n/a  Plan: Patient agrees to discharge.  Patient goals were not met. Patient is being discharged due to not returning since the last visit.  ?????      Geraldine Solar PT, [place] [address] [place], [place], [address] Phone: [phone]   Fax:  [phone]

## 2018-08-22 DIAGNOSIS — M25562 Pain in left knee: Secondary | ICD-10-CM | POA: Diagnosis not present

## 2018-08-22 DIAGNOSIS — M1712 Unilateral primary osteoarthritis, left knee: Secondary | ICD-10-CM | POA: Diagnosis not present

## 2018-08-22 DIAGNOSIS — M1711 Unilateral primary osteoarthritis, right knee: Secondary | ICD-10-CM | POA: Diagnosis not present

## 2018-09-17 ENCOUNTER — Ambulatory Visit (INDEPENDENT_AMBULATORY_CARE_PROVIDER_SITE_OTHER): Payer: 59 | Admitting: Obstetrics and Gynecology

## 2018-09-17 ENCOUNTER — Encounter: Payer: Self-pay | Admitting: Obstetrics and Gynecology

## 2018-09-17 ENCOUNTER — Other Ambulatory Visit: Payer: Self-pay

## 2018-09-17 VITALS — BP 134/83 | HR 70 | Ht 64.25 in | Wt 240.0 lb

## 2018-09-17 DIAGNOSIS — L292 Pruritus vulvae: Secondary | ICD-10-CM | POA: Diagnosis not present

## 2018-09-17 NOTE — Progress Notes (Signed)
Patient ID: Deborah Kennedy, female   DOB: January 09, 1961, 57 y.o.   MRN: [id number]    [facility] Visit  @DATE @            Patient name: Deborah Kennedy MRN [id number]  Date of birth: 1961/01/27  CC & HPI: CC : vulvar itching.  Anice Rayann Jolley is a 57 y.o. female presenting today for pelvic exam. Had hysterectomy  Has itching in inner thigh only at night, has tried hydrocortisone cream it alleviates itching. Once she showers it causes skin irritation for a period of time that resolves  Has occasional discomfort in her left side since hysterectomy in 2012 once in a while, doesn't bother her greatly, and is significantly less than the pain in the similar area that she complained of prior to the hysterectomy (with ovarian preservation)  ROS:  ROS +inner thigh irritation -fever -chills All systems are negative except as noted in the HPI and PMH.  Pertinent History Reviewed:   Reviewed: Significant for hysterectomy Medical         Past Medical History:  Diagnosis Date  . Acute medial meniscus tear, right, subsequent encounter   . Anxiety   . Complication of anesthesia    reaction to epidural medication  . Depression                               Surgical Hx:    Past Surgical History:  Procedure Laterality Date  . ABDOMINAL HYSTERECTOMY  10/18/2011   Procedure: HYSTERECTOMY ABDOMINAL;  Surgeon: Jonnie Kind, MD;  Location: [facility];  Service: Gynecology;  Laterality: N/A;  . CERVICAL FUSION  2008  . KNEE ARTHROSCOPY WITH MEDIAL MENISECTOMY Right 06/14/2017   Procedure: RIGHT KNEE ARTHROSCOPY WITH MEDIAL MENISECTOMY;  Surgeon: Elsie Saas, MD;  Location: [facility];  Service: Orthopedics;  Laterality: Right;  . TUBAL LIGATION     Medications: Reviewed & Updated - see associated section                       Current Outpatient Medications:  .  acetaminophen (TYLENOL) 500 MG tablet, Take 2 tablets (1,000 mg total) by mouth every 6  (six) hours as needed (max dose of tylenol of 3 grams a day including tylenol in [place]). Aches and pains, Disp: 30 tablet, Rfl: 0 .  naproxen sodium (ALEVE) 220 MG tablet, Take 220 mg by mouth., Disp: , Rfl:    Social History: Reviewed -  reports that she has never smoked. She has never used smokeless tobacco.  Objective Findings:  Vitals: Blood pressure 134/83, pulse 70, height 5' 4.25" (1.632 m), weight 240 lb (108.9 kg), last menstrual period 10/15/2011.  PHYSICAL EXAMINATION General appearance - alert, well appearing, and in no distress and oriented to person, place, and time Mental status - alert, oriented to person, place, and time, normal mood, behavior, speech, dress, motor activity, and thought processes, affect appropriate to mood  PELVIC External - normal appearing skin on  Vagina- healthy vaginal secretions, top of vagina well supported Cervix - surgically absent, incision healed, scar tissue band, thicker and firm Uterus - surgically absent  Assessment & Plan:   A:  1.  normal pelvic exam s/p hysterectomy 2.  Normal-appearing vulvar skin, no identified lesions P:  1.  Apply 1% hydrocortisone cream 3 times week for 2 week then report on the patient's itching through MyChart  By  signing my name below, I, Samul Dada, attest that this documentation has been prepared under the direction and in the presence of Jonnie Kind, MD. Electronically Signed: [place]. 09/17/18. 11:22 AM.  I personally performed the services described in this documentation, which was SCRIBED in my presence. The recorded information has been reviewed and considered accurate. It has been edited as necessary during review. Jonnie Kind, MD

## 2018-11-11 DIAGNOSIS — R079 Chest pain, unspecified: Secondary | ICD-10-CM | POA: Diagnosis not present

## 2018-11-11 DIAGNOSIS — N3281 Overactive bladder: Secondary | ICD-10-CM | POA: Diagnosis not present

## 2018-11-11 DIAGNOSIS — A64 Unspecified sexually transmitted disease: Secondary | ICD-10-CM | POA: Diagnosis not present

## 2018-11-11 DIAGNOSIS — M25569 Pain in unspecified knee: Secondary | ICD-10-CM | POA: Diagnosis not present

## 2018-11-11 DIAGNOSIS — F413 Other mixed anxiety disorders: Secondary | ICD-10-CM | POA: Diagnosis not present

## 2018-11-11 DIAGNOSIS — I10 Essential (primary) hypertension: Secondary | ICD-10-CM | POA: Diagnosis not present

## 2018-11-14 MED FILL — MYRBETRIQ ER 25 MG TABLET: 25 | 30 days supply | Qty: 30 | Fill #0

## 2018-11-14 MED FILL — BYSTOLIC 5 MG TABLET: 5 | 90 days supply | Qty: 90 | Fill #0

## 2018-11-24 NOTE — H&P (Signed)
TOTAL KNEE ADMISSION H&P  Patient is being admitted for left total knee arthroplasty.  Subjective:  Chief Complaint:left knee pain.  HPI: Deborah Kennedy, 58 y.o. female, has a history of pain and functional disability in the left knee due to arthritis and has failed non-surgical conservative treatments for greater than 12 weeks to includecorticosteriod injections and activity modification.  Onset of symptoms was gradual, starting 2 years ago with gradually worsening course since that time. The patient noted no past surgery on the left knee(s).  Patient currently rates pain in the left knee(s) at 5 out of 10 with activity. Patient has worsening of pain with activity and weight bearing and crepitus.  Patient has evidence of bone-on-bone arthritis in the medial and patellofemoral compartments with varus deformity by imaging studies. There is no active infection.  Patient Active Problem List   Diagnosis Date Noted  . Acute medial meniscus tear, right, subsequent encounter   . VERTIGO 09/06/2009  . FATIGUE 09/06/2009  . OBESITY, MILD 01/07/2008  . Abdominal pain, other specified site 01/07/2008   Past Medical History:  Diagnosis Date  . Acute medial meniscus tear, right, subsequent encounter   . Anxiety   . Complication of anesthesia    reaction to epidural medication  . Depression     Past Surgical History:  Procedure Laterality Date  . ABDOMINAL HYSTERECTOMY  10/18/2011   Procedure: HYSTERECTOMY ABDOMINAL;  Surgeon: Jonnie Kind, MD;  Location[facility];  Service: Gynecology;  Laterality: N/A;  . CERVICAL FUSION  2008  . KNEE ARTHROSCOPY WITH MEDIAL MENISECTOMY Right 06/14/2017   Procedure: RIGHT KNEE ARTHROSCOPY WITH MEDIAL MENISECTOMY;  Surgeon: Elsie Saas, MD;  Location: [facility];  Service: Orthopedics;  Laterality: Right;  . TUBAL LIGATION      No current facility-administered medications for this encounter.    Current Outpatient Medications    Medication Sig Dispense Refill Last Dose  . ciprofloxacin (CIPRO) 250 MG tablet Take 250 mg by mouth 2 (two) times daily.     . naproxen sodium (ALEVE) 220 MG tablet Take 220 mg by mouth daily as needed (for pain or headache).    Taking   Allergies  Allergen Reactions  . Shellfish Allergy Itching  . Erythromycin Other (See Comments)    Unknown  . Other Itching and Other (See Comments)    Pt states that she had a reaction to her epidural during labor in 1996. Severe itching.    Social History   Tobacco Use  . Smoking status: Never Smoker  . Smokeless tobacco: Never Used  Substance Use Topics  . Alcohol use: No    Family History  Problem Relation Age of Onset  . Hypertension Mother   . Heart Problems Sister      Review of Systems  Constitutional: Negative for chills and fever.  HENT: Negative for congestion, sore throat and tinnitus.   Eyes: Negative for double vision, photophobia and pain.  Respiratory: Negative for cough, shortness of breath and wheezing.   Cardiovascular: Negative for chest pain, palpitations and orthopnea.  Gastrointestinal: Negative for heartburn, nausea and vomiting.  Genitourinary: Negative for dysuria, frequency and urgency.  Musculoskeletal: Positive for joint pain.  Neurological: Negative for dizziness, weakness and headaches.    Objective:  Physical Exam  Well nourished and well developed. Antalgic gait with use of crutch. General: Alert and oriented x3, cooperative and pleasant, no acute distress. Head: normocephalic, atraumatic, neck supple. Eyes: EOMI. Respiratory: breath sounds clear in all fields, no wheezing,  rales, or rhonchi. Cardiovascular: Regular rate and rhythm, no murmurs, gallops or rubs.  Abdomen: non-tender to palpation and soft, normoactive bowel sounds.  Musculoskeletal: Left Knee Exam:  No effusion.  Range of motion is 5-125 degrees.  Marked crepitus on range of motion of the knee.  Positive medial joint line  tenderness. Positive lateral joint line tenderness.  Stable knee.  Calves soft and nontender. Motor function intact in LE. Strength 5/5 LE bilaterally. Neuro: Distal pulses 2+. Sensation to light touch intact in LE.  Vital signs in last 24 hours:  Blood pressure: 138/95 mmHg Pulse: 74 bpm  Labs:   Estimated body mass index is 40.88 kg/m as calculated from the following:   Height as of 09/17/18: 5' 4.25" (1.632 m).   Weight as of 09/17/18: 108.9 kg.   Imaging Review Plain radiographs demonstrate severe degenerative joint disease of the left knee(s). The overall alignment ismild varus. The bone quality appears to be adequate for age and reported activity level.   Preoperative templating of the joint replacement has been completed, documented, and submitted to the Operating Room personnel in order to optimize intra-operative equipment management.   Anticipated LOS equal to or greater than 2 midnights due to - Age 27 and older with one or more of the following:  - Obesity  - Expected need for hospital services (PT, OT, Nursing) required for safe  discharge  - Anticipated need for postoperative skilled nursing care or inpatient rehab  - Active co-morbidities: None OR   - Unanticipated findings during/Post Surgery: None  - Patient is a high risk of re-admission due to: None     Assessment/Plan:  End stage arthritis, left knee   The patient history, physical examination, clinical judgment of the provider and imaging studies are consistent with end stage degenerative joint disease of the left knee(s) and total knee arthroplasty is deemed medically necessary. The treatment options including medical management, injection therapy arthroscopy and arthroplasty were discussed at length. The risks and benefits of total knee arthroplasty were presented and reviewed. The risks due to aseptic loosening, infection, stiffness, patella tracking problems, thromboembolic complications and other  imponderables were discussed. The patient acknowledged the explanation, agreed to proceed with the plan and consent was signed. Patient is being admitted for inpatient treatment for surgery, pain control, PT, OT, prophylactic antibiotics, VTE prophylaxis, progressive ambulation and ADL's and discharge planning. The patient is planning to be discharged home.   Therapy Plans: outpatient therapy at [facility] in [place] Disposition: Home with husband Planned DVT Prophylaxis: aspirin 325mg  BID DME needed: walker PCP: Dr. Iona Beard (PCP) TXA: IV Allergies: NKDA Anesthesia Concerns: itching with epidural BMI: 41  - Patient was instructed on what medications to stop prior to surgery. - Follow-up visit in 2 weeks with Dr. Wynelle Link - Begin physical therapy following surgery - Pre-operative lab work as pre-surgical testing - Prescriptions will be provided in hospital at time of discharge  Theresa Duty, PA-C Orthopedic Surgery [facility] [place]

## 2018-11-26 NOTE — Patient Instructions (Addendum)
Deborah Kennedy  11/26/2018   Your procedure is scheduled on: 12-01-18    Report to [facility] Main  Entrance    Report to Admitting at 8:00 AM    Call this number if you have problems the morning of surgery [phone]    Remember: Do not eat food or drink liquids :After Midnight.    BRUSH YOUR TEETH MORNING OF SURGERY AND RINSE YOUR MOUTH OUT, NO CHEWING GUM CANDY OR MINTS.     Take these medicines the morning of surgery with A SIP OF WATER: Bystolic (Nebivolol)                               You may not have any metal on your body including hair pins and              piercings  Do not wear jewelry, make-up, lotions, powders or perfumes, deodorant             Do not wear nail polish.  Do not shave  48 hours prior to surgery.                 Do not bring valuables to the hospital. [place].  Contacts, dentures or bridgework may not be worn into surgery.  Leave suitcase in the car. After surgery it may be brought to your room.     Patients discharged the day of surgery will not be allowed to drive home. IF YOU ARE HAVING SURGERY AND GOING HOME THE SAME DAY, YOU MUST HAVE AN ADULT TO DRIVE YOU HOME AND BE WITH YOU FOR 24 HOURS. YOU MAY GO HOME BY TAXI OR UBER OR ORTHERWISE, BUT AN ADULT MUST ACCOMPANY YOU HOME AND STAY WITH YOU FOR 24 HOURS.      Special Instructions: N/A              Please read over the following fact sheets you were given: _____________________________________________________________________             [facility] - Preparing for Surgery Before surgery, you can play an important role.  Because skin is not sterile, your skin needs to be as free of germs as possible.  You can reduce the number of germs on your skin by washing with CHG (chlorahexidine gluconate) soap before surgery.  CHG is an antiseptic cleaner which kills germs and bonds with the skin to continue killing germs  even after washing. Please DO NOT use if you have an allergy to CHG or antibacterial soaps.  If your skin becomes reddened/irritated stop using the CHG and inform your nurse when you arrive at Short Stay. Do not shave (including legs and underarms) for at least 48 hours prior to the first CHG shower.  You may shave your face/neck. Please follow these instructions carefully:  1.  Shower with CHG Soap the night before surgery and the  morning of Surgery.  2.  If you choose to wash your hair, wash your hair first as usual with your  normal  shampoo.  3.  After you shampoo, rinse your hair and body thoroughly to remove the  shampoo.  4.  Use CHG as you would any other liquid soap.  You can apply chg directly  to the skin and wash                       Gently with a scrungie or clean washcloth.  5.  Apply the CHG Soap to your body ONLY FROM THE NECK DOWN.   Do not use on face/ open                           Wound or open sores. Avoid contact with eyes, ears mouth and genitals (private parts).                       Wash face,  Genitals (private parts) with your normal soap.             6.  Wash thoroughly, paying special attention to the area where your surgery  will be performed.  7.  Thoroughly rinse your body with warm water from the neck down.  8.  DO NOT shower/wash with your normal soap after using and rinsing off  the CHG Soap.                9.  Pat yourself dry with a clean towel.            10.  Wear clean pajamas.            11.  Place clean sheets on your bed the night of your first shower and do not  sleep with pets. Day of Surgery : Do not apply any lotions/deodorants the morning of surgery.  Please wear clean clothes to the hospital/surgery center.  FAILURE TO FOLLOW THESE INSTRUCTIONS MAY RESULT IN THE CANCELLATION OF YOUR SURGERY PATIENT SIGNATURE_________________________________  NURSE  SIGNATURE__________________________________  ________________________________________________________________________   Adam Phenix  An incentive spirometer is a tool that can help keep your lungs clear and active. This tool measures how well you are filling your lungs with each breath. Taking long deep breaths may help reverse or decrease the chance of developing breathing (pulmonary) problems (especially infection) following:  A long period of time when you are unable to move or be active. BEFORE THE PROCEDURE   If the spirometer includes an indicator to show your best effort, your nurse or respiratory therapist will set it to a desired goal.  If possible, sit up straight or lean slightly forward. Try not to slouch.  Hold the incentive spirometer in an upright position. INSTRUCTIONS FOR USE  1. Sit on the edge of your bed if possible, or sit up as far as you can in bed or on a chair. 2. Hold the incentive spirometer in an upright position. 3. Breathe out normally. 4. Place the mouthpiece in your mouth and seal your lips tightly around it. 5. Breathe in slowly and as deeply as possible, raising the piston or the ball toward the top of the column. 6. Hold your breath for 3-5 seconds or for as long as possible. Allow the piston or ball to fall to the bottom of the column. 7. Remove the mouthpiece from your mouth and breathe out normally. 8. Rest for a few seconds and repeat Steps 1 through 7 at least 10 times every 1-2 hours when you are awake. Take your time and take a few normal breaths between deep breaths. 9. The spirometer may include an indicator to show  your best effort. Use the indicator as a goal to work toward during each repetition. 10. After each set of 10 deep breaths, practice coughing to be sure your lungs are clear. If you have an incision (the cut made at the time of surgery), support your incision when coughing by placing a pillow or rolled up towels firmly  against it. Once you are able to get out of bed, walk around indoors and cough well. You may stop using the incentive spirometer when instructed by your caregiver.  RISKS AND COMPLICATIONS  Take your time so you do not get dizzy or light-headed.  If you are in pain, you may need to take or ask for pain medication before doing incentive spirometry. It is harder to take a deep breath if you are having pain. AFTER USE  Rest and breathe slowly and easily.  It can be helpful to keep track of a log of your progress. Your caregiver can provide you with a simple table to help with this. If you are using the spirometer at home, follow these instructions: [place] IF:   You are having difficultly using the spirometer.  You have trouble using the spirometer as often as instructed.  Your pain medication is not giving enough relief while using the spirometer.  You develop fever of 100.5 F (38.1 C) or higher. SEEK IMMEDIATE MEDICAL CARE IF:   You cough up bloody sputum that had not been present before.  You develop fever of 102 F (38.9 C) or greater.  You develop worsening pain at or near the incision site. MAKE SURE YOU:   Understand these instructions.  Will watch your condition.  Will get help right away if you are not doing well or get worse. Document Released: 02/25/2007 Document Revised: 01/07/2012 Document Reviewed: 04/28/2007 ExitCare Patient Information 2014 ExitCare, [place].   ________________________________________________________________________  WHAT IS A BLOOD TRANSFUSION? Blood Transfusion Information  A transfusion is the replacement of blood or some of its parts. Blood is made up of multiple cells which provide different functions.  Red blood cells carry oxygen and are used for blood loss replacement.  White blood cells fight against infection.  Platelets control bleeding.  Plasma helps clot blood.  Other blood products are available for  specialized needs, such as hemophilia or other clotting disorders. BEFORE THE TRANSFUSION  Who gives blood for transfusions?   Healthy volunteers who are fully evaluated to make sure their blood is safe. This is blood bank blood. Transfusion therapy is the safest it has ever been in the practice of medicine. Before blood is taken from a donor, a complete history is taken to make sure that person has no history of diseases nor engages in risky social behavior (examples are intravenous drug use or sexual activity with multiple partners). The donor's travel history is screened to minimize risk of transmitting infections, such as malaria. The donated blood is tested for signs of infectious diseases, such as HIV and hepatitis. The blood is then tested to be sure it is compatible with you in order to minimize the chance of a transfusion reaction. If you or a relative donates blood, this is often done in anticipation of surgery and is not appropriate for emergency situations. It takes many days to process the donated blood. RISKS AND COMPLICATIONS Although transfusion therapy is very safe and saves many lives, the main dangers of transfusion include:   Getting an infectious disease.  Developing a transfusion reaction. This is an allergic reaction to  something in the blood you were given. Every precaution is taken to prevent this. The decision to have a blood transfusion has been considered carefully by your caregiver before blood is given. Blood is not given unless the benefits outweigh the risks. AFTER THE TRANSFUSION  Right after receiving a blood transfusion, you will usually feel much better and more energetic. This is especially true if your red blood cells have gotten low (anemic). The transfusion raises the level of the red blood cells which carry oxygen, and this usually causes an energy increase.  The nurse administering the transfusion will monitor you carefully for complications. HOME CARE  INSTRUCTIONS  No special instructions are needed after a transfusion. You may find your energy is better. Speak with your caregiver about any limitations on activity for underlying diseases you may have. SEEK MEDICAL CARE IF:   Your condition is not improving after your transfusion.  You develop redness or irritation at the intravenous (IV) site. SEEK IMMEDIATE MEDICAL CARE IF:  Any of the following symptoms occur over the next 12 hours:  Shaking chills.  You have a temperature by mouth above 102 F (38.9 C), not controlled by medicine.  Chest, back, or muscle pain.  People around you feel you are not acting correctly or are confused.  Shortness of breath or difficulty breathing.  Dizziness and fainting.  You get a rash or develop hives.  You have a decrease in urine output.  Your urine turns a dark color or changes to pink, red, or brown. Any of the following symptoms occur over the next 10 days:  You have a temperature by mouth above 102 F (38.9 C), not controlled by medicine.  Shortness of breath.  Weakness after normal activity.  The white part of the eye turns yellow (jaundice).  You have a decrease in the amount of urine or are urinating less often.  Your urine turns a dark color or changes to pink, red, or brown. Document Released: 10/12/2000 Document Revised: 01/07/2012 Document Reviewed: 05/31/2008 [facility] Patient Information 2014 [place], [place].  _______________________________________________________________________

## 2018-11-27 ENCOUNTER — Encounter (HOSPITAL_COMMUNITY): Payer: Self-pay | Admitting: Physician Assistant

## 2018-11-27 ENCOUNTER — Encounter (HOSPITAL_COMMUNITY): Payer: Self-pay

## 2018-11-27 ENCOUNTER — Encounter (HOSPITAL_COMMUNITY)
Admission: RE | Admit: 2018-11-27 | Discharge: 2018-11-27 | Disposition: A | Discharge status: Home / Self Care | Payer: 59 | Source: Ambulatory Visit | Attending: Orthopedic Surgery | Admitting: Orthopedic Surgery

## 2018-11-27 ENCOUNTER — Other Ambulatory Visit: Payer: Self-pay

## 2018-11-27 DIAGNOSIS — Z01818 Encounter for other preprocedural examination: Secondary | ICD-10-CM | POA: Diagnosis not present

## 2018-11-27 DIAGNOSIS — M1712 Unilateral primary osteoarthritis, left knee: Secondary | ICD-10-CM | POA: Insufficient documentation

## 2018-11-27 DIAGNOSIS — I10 Essential (primary) hypertension: Secondary | ICD-10-CM | POA: Insufficient documentation

## 2018-11-27 HISTORY — DX: Essential (primary) hypertension: I10

## 2018-11-27 LAB — CBC WITH DIFFERENTIAL/PLATELET
Abs Immature Granulocytes: 0.02 10*3/uL (ref 0.00–0.07)
Basophils Absolute: 0 10*3/uL (ref 0.0–0.1)
Basophils Relative: 1 %
Eosinophils Absolute: 0.1 10*3/uL (ref 0.0–0.5)
Eosinophils Relative: 3 %
HCT: 40.3 % (ref 36.0–46.0)
Hemoglobin: 12.6 g/dL (ref 12.0–15.0)
Immature Granulocytes: 1 %
Lymphocytes Relative: 37 %
Lymphs Abs: 1.6 10*3/uL (ref 0.7–4.0)
MCH: 27.2 pg (ref 26.0–34.0)
MCHC: 31.3 g/dL (ref 30.0–36.0)
MCV: 86.9 fL (ref 80.0–100.0)
Monocytes Absolute: 0.3 10*3/uL (ref 0.1–1.0)
Monocytes Relative: 7 %
Neutro Abs: 2.3 10*3/uL (ref 1.7–7.7)
Neutrophils Relative %: 51 %
Platelets: 199 10*3/uL (ref 150–400)
RBC: 4.64 MIL/uL (ref 3.87–5.11)
RDW: 12.8 % (ref 11.5–15.5)
WBC: 4.3 10*3/uL (ref 4.0–10.5)
nRBC: 0 % (ref 0.0–0.2)

## 2018-11-27 LAB — URINALYSIS, ROUTINE W REFLEX MICROSCOPIC
Bilirubin Urine: NEGATIVE
Glucose, UA: NEGATIVE mg/dL
Hgb urine dipstick: NEGATIVE
Ketones, ur: NEGATIVE mg/dL
Leukocytes, UA: NEGATIVE
Nitrite: NEGATIVE
Protein, ur: NEGATIVE mg/dL
Specific Gravity, Urine: 1.026 (ref 1.005–1.030)
pH: 5 (ref 5.0–8.0)

## 2018-11-27 LAB — SURGICAL PCR SCREEN
MRSA, PCR: NEGATIVE
Staphylococcus aureus: NEGATIVE

## 2018-11-27 LAB — PROTIME-INR
INR: 1.04
Prothrombin Time: 13.5 seconds (ref 11.4–15.2)

## 2018-11-27 LAB — COMPREHENSIVE METABOLIC PANEL
ALT: 17 U/L (ref 0–44)
AST: 29 U/L (ref 15–41)
Albumin: 4.4 g/dL (ref 3.5–5.0)
Alkaline Phosphatase: 89 U/L (ref 38–126)
Anion gap: 5 (ref 5–15)
BUN: 15 mg/dL (ref 6–20)
CO2: 26 mmol/L (ref 22–32)
Calcium: 9.1 mg/dL (ref 8.9–10.3)
Chloride: 107 mmol/L (ref 98–111)
Creatinine, Ser: 0.76 mg/dL (ref 0.44–1.00)
GFR calc Af Amer: >60 mL/min (ref >60)
GFR calc non Af Amer: >60 mL/min (ref >60)
Glucose, Bld: 93 mg/dL (ref 70–99)
Potassium: 4.8 mmol/L (ref 3.5–5.1)
Sodium: 138 mmol/L (ref 135–145)
Total Bilirubin: 1.2 mg/dL (ref 0.3–1.2)
Total Protein: 7.2 g/dL (ref 6.5–8.1)

## 2018-11-27 LAB — APTT: aPTT: 29 seconds (ref 24–36)

## 2018-11-27 LAB — ABO/RH: ABO/RH(D): AB POS

## 2018-11-28 ENCOUNTER — Other Ambulatory Visit: Payer: Self-pay | Admitting: *Deleted

## 2018-11-28 NOTE — Patient Outreach (Signed)
[facility]) Care Management  11/28/2018  Anisten Tomassi Nov 18, 1960 [id number]   Preoperative Screening Call Referral received: 11/13/18 Surgery date: 12/01/18 Insurance: [employer] Save Plan  Subjective: Initial unsuccessful telephone call to patient's preferred number in order to complete pre-op screening; no answer, left HIPAA compliant voicemail message requesting return call.   Objective: Per the electronic medical record, is Mrs. Scorza is scheduled for left total knee arthroplasty  on 12/01/18 at [facility].  She completed her pre-op admission testing visit on 11/27/18 .  Comorbidities include: Obesity  Plan: If patient does not return call today, this RNCM will call patient for transition of care outreach within 72 hours of hospital discharge notification.    Barrington Ellison RN,CCM,CDE [place] Management Coordinator Office Phone [phone] Office Fax [phone]

## 2018-11-28 NOTE — Patient Outreach (Signed)
[place] [facility]) Care Management  11/28/2018  Lucillia Corson 1960-11-08 [id number]   Preoperative Screening Call Referral received: 11/13/18 Surgery date: 12/01/18 Insurance: [employer] Health Save Plan  Subjective: Returned call to patient after she left a voice mail for this RNCM at 11:25 am in response to voice mail left for her earlier today by this RNCM. Mrs. Othman states this is not a good time for her to complete the preoperative assessment call and states she will call this RNCM back.  Objective: Per the electronic medical record, is Mrs. Leever is scheduled for left total knee arthroplasty  on 12/01/18 at [facility].  She completed her pre-op admission testing visit on 11/27/18 .  Comorbidities include: Obesity  Plan: If patient does not return call today, this RNCM will call patient for transition of care outreach within 72 hours of hospital discharge notification.    Barrington Ellison RN,CCM,CDE [place] Management Coordinator Office Phone [phone] Office Fax [phone]

## 2018-11-30 MED ORDER — BUPIVACAINE LIPOSOME 1.3 % IJ SUSP
20.0000 mL | Freq: Once | INTRAMUSCULAR | Status: DC
  Filled 2018-11-30: qty 20

## 2018-12-01 ENCOUNTER — Encounter (HOSPITAL_COMMUNITY)
Admission: RE | Disposition: A | Discharge status: Home / Self Care | Payer: Self-pay | Source: Home / Self Care | Attending: Orthopedic Surgery

## 2018-12-01 ENCOUNTER — Inpatient Hospital Stay (HOSPITAL_COMMUNITY): Payer: 59 | Admitting: Physician Assistant

## 2018-12-01 ENCOUNTER — Inpatient Hospital Stay (HOSPITAL_COMMUNITY)
Admission: RE | Admit: 2018-12-01 | Discharge: 2018-12-03 | DRG: 470 | Disposition: A | Discharge status: Home / Self Care | Payer: 59 | Attending: Orthopedic Surgery | Admitting: Orthopedic Surgery

## 2018-12-01 ENCOUNTER — Inpatient Hospital Stay (HOSPITAL_COMMUNITY): Payer: 59 | Admitting: Anesthesiology

## 2018-12-01 ENCOUNTER — Other Ambulatory Visit: Payer: Self-pay

## 2018-12-01 ENCOUNTER — Encounter (HOSPITAL_COMMUNITY): Payer: Self-pay | Admitting: Anesthesiology

## 2018-12-01 DIAGNOSIS — M179 Osteoarthritis of knee, unspecified: Secondary | ICD-10-CM | POA: Diagnosis present

## 2018-12-01 DIAGNOSIS — I10 Essential (primary) hypertension: Secondary | ICD-10-CM | POA: Diagnosis not present

## 2018-12-01 DIAGNOSIS — E669 Obesity, unspecified: Secondary | ICD-10-CM | POA: Diagnosis present

## 2018-12-01 DIAGNOSIS — Z90710 Acquired absence of both cervix and uterus: Secondary | ICD-10-CM | POA: Diagnosis not present

## 2018-12-01 DIAGNOSIS — Z91013 Allergy to seafood: Secondary | ICD-10-CM

## 2018-12-01 DIAGNOSIS — Z792 Long term (current) use of antibiotics: Secondary | ICD-10-CM | POA: Diagnosis not present

## 2018-12-01 DIAGNOSIS — Z791 Long term (current) use of non-steroidal anti-inflammatories (NSAID): Secondary | ICD-10-CM

## 2018-12-01 DIAGNOSIS — Z8249 Family history of ischemic heart disease and other diseases of the circulatory system: Secondary | ICD-10-CM

## 2018-12-01 DIAGNOSIS — M1712 Unilateral primary osteoarthritis, left knee: Principal | ICD-10-CM | POA: Diagnosis present

## 2018-12-01 DIAGNOSIS — M1710 Unilateral primary osteoarthritis, unspecified knee: Secondary | ICD-10-CM | POA: Diagnosis not present

## 2018-12-01 DIAGNOSIS — Z981 Arthrodesis status: Secondary | ICD-10-CM

## 2018-12-01 DIAGNOSIS — Z79899 Other long term (current) drug therapy: Secondary | ICD-10-CM

## 2018-12-01 DIAGNOSIS — Z6841 Body Mass Index (BMI) 40.0 and over, adult: Secondary | ICD-10-CM | POA: Diagnosis not present

## 2018-12-01 DIAGNOSIS — Z888 Allergy status to other drugs, medicaments and biological substances status: Secondary | ICD-10-CM | POA: Diagnosis not present

## 2018-12-01 DIAGNOSIS — G8918 Other acute postprocedural pain: Secondary | ICD-10-CM | POA: Diagnosis not present

## 2018-12-01 HISTORY — PX: TOTAL KNEE ARTHROPLASTY: SHX125

## 2018-12-01 LAB — TYPE AND SCREEN
ABO/RH(D): AB POS
Antibody Screen: NEGATIVE

## 2018-12-01 SURGERY — ARTHROPLASTY, KNEE, TOTAL
Anesthesia: Spinal | Laterality: Left

## 2018-12-01 MED ORDER — PROPOFOL 10 MG/ML IV BOLUS
INTRAVENOUS | Status: AC
  Filled 2018-12-01: qty 20

## 2018-12-01 MED ORDER — FLEET ENEMA 7-19 GM/118ML RE ENEM: 1.0000 | ENEMA | Freq: Once | RECTAL | Status: DC | PRN

## 2018-12-01 MED ORDER — MIDAZOLAM HCL 2 MG/2ML IJ SOLN
INTRAMUSCULAR | Status: AC
  Administered 2018-12-01: 1 mg via INTRAVENOUS
  Filled 2018-12-01: qty 2

## 2018-12-01 MED ORDER — SODIUM CHLORIDE (PF) 0.9 % IJ SOLN
INTRAMUSCULAR | Status: AC
  Filled 2018-12-01: qty 50

## 2018-12-01 MED ORDER — METOCLOPRAMIDE HCL 5 MG/ML IJ SOLN: 5.0000 mg | Freq: Three times a day (TID) | INTRAMUSCULAR | Status: DC | PRN

## 2018-12-01 MED ORDER — LIDOCAINE 2% (20 MG/ML) 5 ML SYRINGE
INTRAMUSCULAR | Status: AC
  Filled 2018-12-01: qty 5

## 2018-12-01 MED ORDER — PHENOL 1.4 % MT LIQD
1.0000 | OROMUCOSAL | Status: DC | PRN
  Filled 2018-12-01: qty 177

## 2018-12-01 MED ORDER — DEXAMETHASONE SODIUM PHOSPHATE 10 MG/ML IJ SOLN
INTRAMUSCULAR | Status: AC
  Filled 2018-12-01: qty 1

## 2018-12-01 MED ORDER — METOCLOPRAMIDE HCL 5 MG/ML IJ SOLN: 10.0000 mg | Freq: Once | INTRAMUSCULAR | Status: DC | PRN

## 2018-12-01 MED ORDER — TRAMADOL HCL 50 MG PO TABS: 50.0000 mg | ORAL_TABLET | Freq: Four times a day (QID) | ORAL | Status: DC | PRN

## 2018-12-01 MED ORDER — FENTANYL CITRATE (PF) 100 MCG/2ML IJ SOLN
50.0000 ug | INTRAMUSCULAR | Status: DC
  Administered 2018-12-01: 50 ug via INTRAVENOUS

## 2018-12-01 MED ORDER — PHENYLEPHRINE 40 MCG/ML (10ML) SYRINGE FOR IV PUSH (FOR BLOOD PRESSURE SUPPORT)
PREFILLED_SYRINGE | INTRAVENOUS | Status: AC
  Filled 2018-12-01: qty 10

## 2018-12-01 MED ORDER — DIPHENHYDRAMINE HCL 12.5 MG/5ML PO ELIX: 12.5000 mg | ORAL_SOLUTION | ORAL | Status: DC | PRN

## 2018-12-01 MED ORDER — POLYETHYLENE GLYCOL 3350 17 G PO PACK: 17.0000 g | PACK | Freq: Every day | ORAL | Status: DC | PRN

## 2018-12-01 MED ORDER — ONDANSETRON HCL 4 MG/2ML IJ SOLN
INTRAMUSCULAR | Status: DC | PRN
  Administered 2018-12-01: 4 mg via INTRAVENOUS

## 2018-12-01 MED ORDER — METHOCARBAMOL 500 MG PO TABS
500.0000 mg | ORAL_TABLET | Freq: Four times a day (QID) | ORAL | Status: DC | PRN
  Administered 2018-12-01 – 2018-12-03 (×6): 500 mg via ORAL
  Filled 2018-12-01 (×6): qty 1

## 2018-12-01 MED ORDER — MIDAZOLAM HCL 5 MG/5ML IJ SOLN
INTRAMUSCULAR | Status: DC | PRN
  Administered 2018-12-01: 1 mg via INTRAVENOUS

## 2018-12-01 MED ORDER — PROPOFOL 10 MG/ML IV BOLUS
INTRAVENOUS | Status: AC
  Filled 2018-12-01: qty 40

## 2018-12-01 MED ORDER — DOCUSATE SODIUM 100 MG PO CAPS
100.0000 mg | ORAL_CAPSULE | Freq: Two times a day (BID) | ORAL | Status: DC
  Administered 2018-12-01 – 2018-12-03 (×4): 100 mg via ORAL
  Filled 2018-12-01 (×4): qty 1

## 2018-12-01 MED ORDER — CEFAZOLIN SODIUM-DEXTROSE 2-4 GM/100ML-% IV SOLN
2.0000 g | INTRAVENOUS | Status: AC
  Administered 2018-12-01: 2 g via INTRAVENOUS
  Filled 2018-12-01: qty 100

## 2018-12-01 MED ORDER — EPHEDRINE 5 MG/ML INJ
INTRAVENOUS | Status: AC
  Filled 2018-12-01: qty 10

## 2018-12-01 MED ORDER — SODIUM CHLORIDE (PF) 0.9 % IJ SOLN
INTRAMUSCULAR | Status: DC | PRN
  Administered 2018-12-01: 60 mL

## 2018-12-01 MED ORDER — LIDOCAINE 2% (20 MG/ML) 5 ML SYRINGE
INTRAMUSCULAR | Status: DC | PRN
  Administered 2018-12-01: 50 mg via INTRAVENOUS

## 2018-12-01 MED ORDER — CEFAZOLIN SODIUM-DEXTROSE 2-4 GM/100ML-% IV SOLN
2.0000 g | Freq: Four times a day (QID) | INTRAVENOUS | Status: AC
  Administered 2018-12-01 (×2): 2 g via INTRAVENOUS
  Filled 2018-12-01 (×2): qty 100

## 2018-12-01 MED ORDER — ONDANSETRON HCL 4 MG/2ML IJ SOLN: 4.0000 mg | Freq: Four times a day (QID) | INTRAMUSCULAR | Status: DC | PRN

## 2018-12-01 MED ORDER — MIDAZOLAM HCL 2 MG/2ML IJ SOLN
INTRAMUSCULAR | Status: AC
  Filled 2018-12-01: qty 2

## 2018-12-01 MED ORDER — STERILE WATER FOR IRRIGATION IR SOLN
Status: DC | PRN
  Administered 2018-12-01: 2000 mL

## 2018-12-01 MED ORDER — BISACODYL 10 MG RE SUPP: 10.0000 mg | Freq: Every day | RECTAL | Status: DC | PRN

## 2018-12-01 MED ORDER — MIDAZOLAM HCL 2 MG/2ML IJ SOLN
1.0000 mg | INTRAMUSCULAR | Status: DC
  Administered 2018-12-01 (×2): 1 mg via INTRAVENOUS

## 2018-12-01 MED ORDER — MENTHOL 3 MG MT LOZG: 1.0000 | LOZENGE | OROMUCOSAL | Status: DC | PRN

## 2018-12-01 MED ORDER — BUPIVACAINE-EPINEPHRINE (PF) 0.5% -1:200000 IJ SOLN
INTRAMUSCULAR | Status: DC | PRN
  Administered 2018-12-01: 30 mL via PERINEURAL

## 2018-12-01 MED ORDER — ACETAMINOPHEN 500 MG PO TABS
1000.0000 mg | ORAL_TABLET | Freq: Four times a day (QID) | ORAL | Status: AC
  Administered 2018-12-01 – 2018-12-02 (×4): 1000 mg via ORAL
  Filled 2018-12-01 (×4): qty 2

## 2018-12-01 MED ORDER — 0.9 % SODIUM CHLORIDE (POUR BTL) OPTIME
TOPICAL | Status: DC | PRN
  Administered 2018-12-01: 1000 mL

## 2018-12-01 MED ORDER — TRANEXAMIC ACID-NACL 1000-0.7 MG/100ML-% IV SOLN
1000.0000 mg | Freq: Once | INTRAVENOUS | Status: AC
  Administered 2018-12-01: 1000 mg via INTRAVENOUS
  Filled 2018-12-01: qty 100

## 2018-12-01 MED ORDER — PROPOFOL 10 MG/ML IV BOLUS
INTRAVENOUS | Status: DC | PRN
  Administered 2018-12-01: 30 mg via INTRAVENOUS

## 2018-12-01 MED ORDER — ROCURONIUM BROMIDE 100 MG/10ML IV SOLN
INTRAVENOUS | Status: AC
  Filled 2018-12-01: qty 1

## 2018-12-01 MED ORDER — GABAPENTIN 300 MG PO CAPS
300.0000 mg | ORAL_CAPSULE | Freq: Once | ORAL | Status: AC
  Administered 2018-12-01: 300 mg via ORAL
  Filled 2018-12-01: qty 1

## 2018-12-01 MED ORDER — ONDANSETRON HCL 4 MG PO TABS: 4.0000 mg | ORAL_TABLET | Freq: Four times a day (QID) | ORAL | Status: DC | PRN

## 2018-12-01 MED ORDER — METHOCARBAMOL 500 MG IVPB - SIMPLE MED
INTRAVENOUS | Status: AC
  Filled 2018-12-01: qty 50

## 2018-12-01 MED ORDER — MEPERIDINE HCL 50 MG/ML IJ SOLN: 6.2500 mg | INTRAMUSCULAR | Status: DC | PRN

## 2018-12-01 MED ORDER — SODIUM CHLORIDE 0.9 % IV SOLN
INTRAVENOUS | Status: DC
  Administered 2018-12-01: 15:00:00 via INTRAVENOUS

## 2018-12-01 MED ORDER — BUPIVACAINE IN DEXTROSE 0.75-8.25 % IT SOLN
INTRATHECAL | Status: DC | PRN
  Administered 2018-12-01: 1.6 mL via INTRATHECAL

## 2018-12-01 MED ORDER — SCOPOLAMINE 1 MG/3DAYS TD PT72
1.0000 | MEDICATED_PATCH | Freq: Once | TRANSDERMAL | Status: DC
  Administered 2018-12-01: 1.5 mg via TRANSDERMAL
  Filled 2018-12-01: qty 1

## 2018-12-01 MED ORDER — CHLORHEXIDINE GLUCONATE 4 % EX LIQD: 60.0000 mL | Freq: Once | CUTANEOUS | Status: DC

## 2018-12-01 MED ORDER — NEBIVOLOL HCL 2.5 MG PO TABS
2.5000 mg | ORAL_TABLET | Freq: Every day | ORAL | Status: DC
  Administered 2018-12-02 – 2018-12-03 (×2): 2.5 mg via ORAL
  Filled 2018-12-01 (×2): qty 1

## 2018-12-01 MED ORDER — FENTANYL CITRATE (PF) 100 MCG/2ML IJ SOLN
INTRAMUSCULAR | Status: AC
  Administered 2018-12-01: 50 ug via INTRAVENOUS
  Filled 2018-12-01: qty 2

## 2018-12-01 MED ORDER — DEXAMETHASONE SODIUM PHOSPHATE 10 MG/ML IJ SOLN
10.0000 mg | Freq: Once | INTRAMUSCULAR | Status: AC
  Administered 2018-12-02: 10 mg via INTRAVENOUS
  Filled 2018-12-01: qty 1

## 2018-12-01 MED ORDER — SODIUM CHLORIDE 0.9 % IR SOLN
Status: DC | PRN
  Administered 2018-12-01: 1000 mL

## 2018-12-01 MED ORDER — PROPOFOL 500 MG/50ML IV EMUL
INTRAVENOUS | Status: DC | PRN
  Administered 2018-12-01: 50 ug/kg/min via INTRAVENOUS

## 2018-12-01 MED ORDER — OXYCODONE HCL 5 MG PO TABS
5.0000 mg | ORAL_TABLET | ORAL | Status: DC | PRN
  Administered 2018-12-01 (×2): 5 mg via ORAL
  Administered 2018-12-01: 10 mg via ORAL
  Administered 2018-12-01 – 2018-12-02 (×2): 5 mg via ORAL
  Administered 2018-12-02 (×2): 10 mg via ORAL
  Administered 2018-12-02 – 2018-12-03 (×2): 5 mg via ORAL
  Administered 2018-12-03 (×3): 10 mg via ORAL
  Filled 2018-12-01 (×4): qty 2
  Filled 2018-12-01 (×2): qty 1
  Filled 2018-12-01: qty 2
  Filled 2018-12-01: qty 1
  Filled 2018-12-01: qty 2
  Filled 2018-12-01: qty 1
  Filled 2018-12-01 (×2): qty 2
  Filled 2018-12-01: qty 1

## 2018-12-01 MED ORDER — HYDROMORPHONE HCL 1 MG/ML IJ SOLN: 0.2500 mg | INTRAMUSCULAR | Status: DC | PRN

## 2018-12-01 MED ORDER — PHENYLEPHRINE 40 MCG/ML (10ML) SYRINGE FOR IV PUSH (FOR BLOOD PRESSURE SUPPORT)
PREFILLED_SYRINGE | INTRAVENOUS | Status: DC | PRN
  Administered 2018-12-01 (×3): 80 ug via INTRAVENOUS

## 2018-12-01 MED ORDER — DEXAMETHASONE SODIUM PHOSPHATE 10 MG/ML IJ SOLN
10.0000 mg | Freq: Once | INTRAMUSCULAR | Status: AC
  Administered 2018-12-01: 10 mg via INTRAVENOUS

## 2018-12-01 MED ORDER — METHOCARBAMOL 500 MG IVPB - SIMPLE MED
500.0000 mg | Freq: Four times a day (QID) | INTRAVENOUS | Status: DC | PRN
  Administered 2018-12-01: 500 mg via INTRAVENOUS
  Filled 2018-12-01: qty 50

## 2018-12-01 MED ORDER — ONDANSETRON HCL 4 MG/2ML IJ SOLN
INTRAMUSCULAR | Status: AC
  Filled 2018-12-01: qty 2

## 2018-12-01 MED ORDER — SODIUM CHLORIDE (PF) 0.9 % IJ SOLN
INTRAMUSCULAR | Status: AC
  Filled 2018-12-01: qty 10

## 2018-12-01 MED ORDER — ACETAMINOPHEN 10 MG/ML IV SOLN
1000.0000 mg | Freq: Four times a day (QID) | INTRAVENOUS | Status: DC
  Administered 2018-12-01: 1000 mg via INTRAVENOUS
  Filled 2018-12-01: qty 100

## 2018-12-01 MED ORDER — ASPIRIN EC 325 MG PO TBEC
325.0000 mg | DELAYED_RELEASE_TABLET | Freq: Two times a day (BID) | ORAL | Status: DC
  Administered 2018-12-02 – 2018-12-03 (×3): 325 mg via ORAL
  Filled 2018-12-01 (×3): qty 1

## 2018-12-01 MED ORDER — MORPHINE SULFATE (PF) 2 MG/ML IV SOLN: 1.0000 mg | INTRAVENOUS | Status: DC | PRN

## 2018-12-01 MED ORDER — LACTATED RINGERS IV SOLN
INTRAVENOUS | Status: DC
  Administered 2018-12-01 (×3): via INTRAVENOUS

## 2018-12-01 MED ORDER — TRANEXAMIC ACID-NACL 1000-0.7 MG/100ML-% IV SOLN
1000.0000 mg | INTRAVENOUS | Status: AC
  Administered 2018-12-01: 1000 mg via INTRAVENOUS
  Filled 2018-12-01: qty 100

## 2018-12-01 MED ORDER — GABAPENTIN 300 MG PO CAPS
300.0000 mg | ORAL_CAPSULE | Freq: Three times a day (TID) | ORAL | Status: DC
  Administered 2018-12-01 – 2018-12-03 (×6): 300 mg via ORAL
  Filled 2018-12-01 (×6): qty 1

## 2018-12-01 MED ORDER — BUPIVACAINE LIPOSOME 1.3 % IJ SUSP
INTRAMUSCULAR | Status: DC | PRN
  Administered 2018-12-01: 20 mL

## 2018-12-01 MED ORDER — METOCLOPRAMIDE HCL 5 MG PO TABS: 5.0000 mg | ORAL_TABLET | Freq: Three times a day (TID) | ORAL | Status: DC | PRN

## 2018-12-01 SURGICAL SUPPLY — 62 items
ATTUNE MED DOME PAT 38 KNEE (Knees) ×2 IMPLANT
ATTUNE PS FEM LT SZ 6 CEM KNEE (Femur) ×2 IMPLANT
ATTUNE PSRP INSR SZ6 10 KNEE (Insert) ×1 IMPLANT
BAG SPEC THK2 15X12 ZIP CLS (MISCELLANEOUS) ×1
BAG ZIPLOCK 12X15 (MISCELLANEOUS) ×2 IMPLANT
BANDAGE ACE 6X5 VEL STRL LF (GAUZE/BANDAGES/DRESSINGS) ×2 IMPLANT
BANDAGE ELASTIC 6 VELCRO ST LF (GAUZE/BANDAGES/DRESSINGS) ×1 IMPLANT
BASE TIBIAL ROT PLAT SZ 5 KNEE (Knees) IMPLANT
BLADE SAG 18X100X1.27 (BLADE) ×2 IMPLANT
BLADE SAW SGTL 11.0X1.19X90.0M (BLADE) ×2 IMPLANT
BLADE SURG SZ10 CARB STEEL (BLADE) ×4 IMPLANT
BOWL SMART MIX CTS (DISPOSABLE) ×2 IMPLANT
BSPLAT TIB 5 CMNT ROT PLAT STR (Knees) ×1 IMPLANT
CEMENT HV SMART SET (Cement) ×4 IMPLANT
CLSR STERI-STRIP ANTIMIC 1/2X4 (GAUZE/BANDAGES/DRESSINGS) ×2 IMPLANT
COVER SURGICAL LIGHT HANDLE (MISCELLANEOUS) ×2 IMPLANT
COVER WAND RF STERILE (DRAPES) ×1 IMPLANT
CUFF TOURN SGL QUICK 34 (TOURNIQUET CUFF) ×2
CUFF TRNQT CYL 34X4X40X1 (TOURNIQUET CUFF) ×1 IMPLANT
DECANTER SPIKE VIAL GLASS SM (MISCELLANEOUS) ×2 IMPLANT
DRAPE U-SHAPE 47X51 STRL (DRAPES) ×2 IMPLANT
DRSG ADAPTIC 3X8 NADH LF (GAUZE/BANDAGES/DRESSINGS) ×2 IMPLANT
DRSG PAD ABDOMINAL 8X10 ST (GAUZE/BANDAGES/DRESSINGS) ×2 IMPLANT
DURAPREP 26ML APPLICATOR (WOUND CARE) ×2 IMPLANT
ELECT REM PT RETURN 15FT ADLT (MISCELLANEOUS) ×2 IMPLANT
EVACUATOR 1/8 PVC DRAIN (DRAIN) ×2 IMPLANT
GAUZE SPONGE 4X4 12PLY STRL (GAUZE/BANDAGES/DRESSINGS) ×2 IMPLANT
GLOVE BIO SURGEON STRL SZ7 (GLOVE) ×1 IMPLANT
GLOVE BIO SURGEON STRL SZ8 (GLOVE) ×2 IMPLANT
GLOVE BIOGEL PI IND STRL 6.5 (GLOVE) ×1 IMPLANT
GLOVE BIOGEL PI IND STRL 7.0 (GLOVE) ×1 IMPLANT
GLOVE BIOGEL PI IND STRL 8 (GLOVE) ×1 IMPLANT
GLOVE BIOGEL PI INDICATOR 6.5 (GLOVE) ×1
GLOVE BIOGEL PI INDICATOR 7.0 (GLOVE)
GLOVE BIOGEL PI INDICATOR 8 (GLOVE) ×1
GLOVE SURG SS PI 6.5 STRL IVOR (GLOVE) ×2 IMPLANT
GOWN STRL REUS W/TWL LRG LVL3 (GOWN DISPOSABLE) ×4 IMPLANT
GOWN STRL REUS W/TWL XL LVL3 (GOWN DISPOSABLE) ×2 IMPLANT
HANDPIECE INTERPULSE COAX TIP (DISPOSABLE) ×2
HOLDER FOLEY CATH W/STRAP (MISCELLANEOUS) IMPLANT
IMMOBILIZER KNEE 20 (SOFTGOODS) ×2
IMMOBILIZER KNEE 20 THIGH 36 (SOFTGOODS) ×1 IMPLANT
MANIFOLD NEPTUNE II (INSTRUMENTS) ×2 IMPLANT
NS IRRIG 1000ML POUR BTL (IV SOLUTION) ×2 IMPLANT
PACK TOTAL KNEE CUSTOM (KITS) ×2 IMPLANT
PAD ABD 8X10 STRL (GAUZE/BANDAGES/DRESSINGS) ×1 IMPLANT
PADDING CAST COTTON 6X4 STRL (CAST SUPPLIES) ×4 IMPLANT
PIN STEINMAN FIXATION KNEE (PIN) ×1 IMPLANT
PIN THREADED HEADED SIGMA (PIN) ×2 IMPLANT
PROTECTOR NERVE ULNAR (MISCELLANEOUS) ×2 IMPLANT
SET HNDPC FAN SPRY TIP SCT (DISPOSABLE) ×1 IMPLANT
STRIP CLOSURE SKIN 1/2X4 (GAUZE/BANDAGES/DRESSINGS) ×4 IMPLANT
SUT MNCRL AB 4-0 PS2 18 (SUTURE) ×2 IMPLANT
SUT STRATAFIX 0 PDS 27 VIOLET (SUTURE) ×2
SUT VIC AB 2-0 CT1 27 (SUTURE) ×6
SUT VIC AB 2-0 CT1 TAPERPNT 27 (SUTURE) ×3 IMPLANT
SUTURE STRATFX 0 PDS 27 VIOLET (SUTURE) ×1 IMPLANT
TIBIAL BASE ROT PLAT SZ 5 KNEE (Knees) ×2 IMPLANT
TRAY FOLEY MTR SLVR 16FR STAT (SET/KITS/TRAYS/PACK) ×2 IMPLANT
WATER STERILE IRR 1000ML POUR (IV SOLUTION) ×4 IMPLANT
WRAP KNEE MAXI GEL POST OP (GAUZE/BANDAGES/DRESSINGS) ×2 IMPLANT
YANKAUER SUCT BULB TIP 10FT TU (MISCELLANEOUS) ×2 IMPLANT

## 2018-12-01 NOTE — Evaluation (Signed)
Physical Therapy Evaluation Patient Details Name: Deborah Kennedy MRN: [id number] DOB: 1961-10-01 Today's Date: 12/01/2018   History of Present Illness  S/P L TKA   Clinical Impression  The patient reports thigh pain is  8/10. Provided small hot pack. Patient requesting HHPT. Pt admitted with above diagnosis. Pt currently with functional limitations due to the deficits listed below (see PT Problem List).  Pt will benefit from skilled PT to increase their independence and safety with mobility to allow discharge to the venue listed below.       Follow Up Recommendations Follow surgeon's recommendation for DC plan and follow-up therapies(pt request HHPT)    Equipment Recommendations  Rolling walker with 5" wheels;3in1 (PT)    Recommendations for Other Services       Precautions / Restrictions Precautions Precautions: Knee;Fall Required Braces or Orthoses: Knee Immobilizer - Left Knee Immobilizer - Left: Discontinue once straight leg raise with < 10 degree lag      Mobility  Bed Mobility Overal bed mobility: Needs Assistance Bed Mobility: Supine to Sit;Sit to Supine     Supine to sit: Min assist Sit to supine: Min assist   General bed mobility comments: cues for technique  Transfers Overall transfer level: Needs assistance Equipment used: Rolling walker (2 wheeled) Transfers: Sit to/from Stand Sit to Stand: Min assist;From elevated surface         General transfer comment: cues for hand and left leg position  Ambulation/Gait Ambulation/Gait assistance: Min assist Gait Distance (Feet): 90 Feet Assistive device: Rolling walker (2 wheeled) Gait Pattern/deviations: Step-to pattern;Step-through pattern;Decreased step length - left;Decreased stance time - left     General Gait Details: cues for sequence  Stairs            Wheelchair Mobility    Modified Rankin (Stroke Patients Only)       Balance                                             Pertinent Vitals/Pain Pain Assessment: 0-10 Pain Score: 8  Pain Location: left thigh Pain Descriptors / Indicators: Aching;Discomfort;Grimacing Pain Intervention(s): Premedicated before session;Monitored during session;Ice applied;Heat applied;Limited activity within patient's tolerance    Home Living Family/patient expects to be discharged to:: Private residence Living Arrangements: Spouse/significant other Available Help at Discharge: Family Type of Home: House Home Access: Stairs to enter Entrance Stairs-Rails: Psychiatric nurse of Steps: 6 Home Layout: One level Home Equipment: Crutches      Prior Function Level of Independence: Independent with assistive device(s)               Hand Dominance        Extremity/Trunk Assessment   Upper Extremity Assessment Upper Extremity Assessment: Defer to OT evaluation    Lower Extremity Assessment Lower Extremity Assessment: LLE deficits/detail LLE Deficits / Details: + SLR       Communication   Communication: No difficulties  Cognition Arousal/Alertness: Awake/alert Behavior During Therapy: WFL for tasks assessed/performed Overall Cognitive Status: Within Functional Limits for tasks assessed                                 General Comments: patient is very quiet      General Comments      Exercises Total Joint Exercises Ankle Circles/Pumps: AROM;Both;10 reps Quad Sets: AROM;Both;10  reps   Assessment/Plan    PT Assessment Patient needs continued PT services  PT Problem List Decreased strength;Decreased range of motion;Decreased activity tolerance;Decreased knowledge of use of DME;Decreased safety awareness;Decreased mobility;Decreased knowledge of precautions;Pain       PT Treatment Interventions DME instruction;Gait training;Therapeutic exercise;Stair training;Functional mobility training;Therapeutic activities;Patient/family education    PT Goals (Current  goals can be found in the Care Plan section)  Acute Rehab PT Goals Patient Stated Goal: to go home PT Goal Formulation: With patient/family Time For Goal Achievement: 12/06/18 Potential to Achieve Goals: Good    Frequency 7X/week   Barriers to discharge        Co-evaluation               AM-PAC PT "6 Clicks" Mobility  Outcome Measure Help needed turning from your back to your side while in a flat bed without using bedrails?: A Little Help needed moving from lying on your back to sitting on the side of a flat bed without using bedrails?: A Little Help needed moving to and from a bed to a chair (including a wheelchair)?: A Lot Help needed standing up from a chair using your arms (e.g., wheelchair or bedside chair)?: A Lot Help needed to walk in hospital room?: A Lot Help needed climbing 3-5 steps with a railing? : A Lot 6 Click Score: 14    End of Session Equipment Utilized During Treatment: Gait belt Activity Tolerance: Patient limited by pain;Patient tolerated treatment well Patient left: in bed;with call bell/phone within reach;with family/visitor present Nurse Communication: Mobility status PT Visit Diagnosis: Unsteadiness on feet (R26.81);Pain Pain - Right/Left: Left Pain - part of body: Knee    Time: 6734-1937 PT Time Calculation (min) (ACUTE ONLY): 29 min   Charges:   PT Evaluation $PT Eval Low Complexity: 1 Low PT Treatments $Gait Training: 8-22 mins        Tresa Endo PT Acute Rehabilitation Services Pager [phone] Office [phone]   Claretha Cooper 12/01/2018, 6:03 PM

## 2018-12-01 NOTE — Anesthesia Procedure Notes (Signed)
Procedure Name: MAC Date/Time: 12/01/2018 10:26 AM Performed by: West Pugh, CRNA Pre-anesthesia Checklist: Patient identified, Emergency Drugs available, Suction available, Patient being monitored and Timeout performed Patient Re-evaluated:Patient Re-evaluated prior to induction Oxygen Delivery Method: Simple face mask Preoxygenation: Pre-oxygenation with 100% oxygen Induction Type: IV induction Placement Confirmation: positive ETCO2 Dental Injury: Teeth and Oropharynx as per pre-operative assessment

## 2018-12-01 NOTE — Op Note (Signed)
OPERATIVE REPORT-TOTAL KNEE ARTHROPLASTY   Pre-operative diagnosis- Osteoarthritis  Left knee(s)  Post-operative diagnosis- Osteoarthritis Left knee(s)  Procedure-  Left  Total Knee Arthroplasty  Surgeon- Dione Plover. Demontrae Gilbert, MD  Assistant- Ardeen Jourdain, PA-C   Anesthesia-  Adductor canal block and spinal  EBL-25 mL   Drains Hemovac  Tourniquet time-  Total Tourniquet Time Documented: Thigh (Left) - 44 minutes Total: Thigh (Left) - 44 minutes     Complications- None  Condition-PACU - hemodynamically stable.   Brief Clinical Note  Deborah Kennedy is a 58 y.o. year old female with end stage OA of her left knee with progressively worsening pain and dysfunction. She has constant pain, with activity and at rest and significant functional deficits with difficulties even with ADLs. She has had extensive non-op management including analgesics, injections of cortisone and viscosupplements, and home exercise program, but remains in significant pain with significant dysfunction. Radiographs show bone on bone arthritis medial and patellofemoral. She presents now for left Total Knee Arthroplasty.    Procedure in detail---   The patient is brought into the operating room and positioned supine on the operating table. After successful administration of  Adductor canal block and spinal,   a tourniquet is placed high on the  Left thigh(s) and the lower extremity is prepped and draped in the usual sterile fashion. Time out is performed by the operating team and then the  Left lower extremity is wrapped in Esmarch, knee flexed and the tourniquet inflated to 300 mmHg.       A midline incision is made with a ten blade through the subcutaneous tissue to the level of the extensor mechanism. A fresh blade is used to make a medial parapatellar arthrotomy. Soft tissue over the proximal medial tibia is subperiosteally elevated to the joint line with a knife and into the semimembranosus bursa with a  Cobb elevator. Soft tissue over the proximal lateral tibia is elevated with attention being paid to avoiding the patellar tendon on the tibial tubercle. The patella is everted, knee flexed 90 degrees and the ACL and PCL are removed. Findings are bone on bone medial and patellofemoral with massive global osteophytes.        The drill is used to create a starting hole in the distal femur and the canal is thoroughly irrigated with sterile saline to remove the fatty contents. The 5 degree Left  valgus alignment guide is placed into the femoral canal and the distal femoral cutting block is pinned to remove 9 mm off the distal femur. Resection is made with an oscillating saw.      The tibia is subluxed forward and the menisci are removed. The extramedullary alignment guide is placed referencing proximally at the medial aspect of the tibial tubercle and distally along the second metatarsal axis and tibial crest. The block is pinned to remove 35mm off the more deficient medial  side. Resection is made with an oscillating saw. Size 5is the most appropriate size for the tibia and the proximal tibia is prepared with the modular drill and keel punch for that size.      The femoral sizing guide is placed and size 6 is most appropriate. Rotation is marked off the epicondylar axis and confirmed by creating a rectangular flexion gap at 90 degrees. The size 6 cutting block is pinned in this rotation and the anterior, posterior and chamfer cuts are made with the oscillating saw. The intercondylar block is then placed and that cut is made.  Trial size 5 tibial component, trial size 6 posterior stabilized femur and a 10  mm posterior stabilized rotating platform insert trial is placed. Full extension is achieved with excellent varus/valgus and anterior/posterior balance throughout full range of motion. The patella is everted and thickness measured to be 24  mm. Free hand resection is taken to 14 mm, a 38 template is placed, lug  holes are drilled, trial patella is placed, and it tracks normally. Osteophytes are removed off the posterior femur with the trial in place. All trials are removed and the cut bone surfaces prepared with pulsatile lavage. Cement is mixed and once ready for implantation, the size 5 tibial implant, size  6 posterior stabilized femoral component, and the size 38 patella are cemented in place and the patella is held with the clamp. The trial insert is placed and the knee held in full extension. The Exparel (20 ml mixed with 60 ml saline) is injected into the extensor mechanism, posterior capsule, medial and lateral gutters and subcutaneous tissues.  All extruded cement is removed and once the cement is hard the permanent 10 mm posterior stabilized rotating platform insert is placed into the tibial tray.      The wound is copiously irrigated with saline solution and the extensor mechanism closed over a hemovac drain with #1 V-loc suture. The tourniquet is released for a total tourniquet time of 44  minutes. Flexion against gravity is 140 degrees and the patella tracks normally. Subcutaneous tissue is closed with 2.0 vicryl and subcuticular with running 4.0 Monocryl. The incision is cleaned and dried and steri-strips and a bulky sterile dressing are applied. The limb is placed into a knee immobilizer and the patient is awakened and transported to recovery in stable condition.      Please note that a surgical assistant was a medical necessity for this procedure in order to perform it in a safe and expeditious manner. Surgical assistant was necessary to retract the ligaments and vital neurovascular structures to prevent injury to them and also necessary for proper positioning of the limb to allow for anatomic placement of the prosthesis.   Dione Plover Deborah Farrugia, MD    12/01/2018, 11:42 AM

## 2018-12-01 NOTE — Anesthesia Procedure Notes (Signed)
Anesthesia Regional Block: Adductor canal block   Pre-Anesthetic Checklist: ,, timeout performed, Correct Patient, Correct Site, Correct Laterality, Correct Procedure, Correct Position, site marked, Risks and benefits discussed,  Surgical consent,  Pre-op evaluation,  At surgeon's request and post-op pain management  Laterality: Left  Prep: chloraprep       Needles:  Injection technique: Single-shot  Needle Type: Echogenic Stimulator Needle     Needle Length: 9cm  Needle Gauge: 21   Needle insertion depth: 8 cm   Additional Needles:   Procedures:,,,, ultrasound used (permanent image in chart),,,,  Narrative:  Start time: 12/01/2018 9:50 AM End time: 12/01/2018 9:54 AM Injection made incrementally with aspirations every 5 mL.  Performed by: Personally  Anesthesiologist: Josephine Igo, MD  Additional Notes: Timeout performed. Patient sedated. Relevant anatomy ID'd using [place]. Incremental 2-51ml injection of LA with frequent aspiration. Patient tolerated procedure well.        Left Adductor Canal Block

## 2018-12-01 NOTE — Progress Notes (Signed)
AssistedDr. Foster with left, ultrasound guided, adductor canal block. Side rails up, monitors on throughout procedure. See vital signs in flow sheet. Tolerated Procedure well.  

## 2018-12-01 NOTE — Anesthesia Procedure Notes (Signed)
Spinal  Patient location during procedure: OR Start time: 12/01/2018 10:28 AM End time: 12/01/2018 10:31 AM Staffing Anesthesiologist: Josephine Igo, MD Performed: anesthesiologist  Preanesthetic Checklist Completed: patient identified, site marked, surgical consent, pre-op evaluation, timeout performed, IV checked, risks and benefits discussed and monitors and equipment checked Spinal Block Patient position: sitting Prep: site prepped and draped and DuraPrep Patient monitoring: heart rate, cardiac monitor, continuous pulse ox and blood pressure Approach: midline Location: L3-4 Injection technique: single-shot Needle Needle type: Pencan  Needle gauge: 24 G Needle length: 9 cm Needle insertion depth: 7 cm Assessment Sensory level: T4 Additional Notes Patient tolerated procedure well. Adequate sensory level.

## 2018-12-01 NOTE — Anesthesia Preprocedure Evaluation (Addendum)
Anesthesia Evaluation  Patient identified by MRN, date of birth, ID band Patient awake  General Assessment Comment:Had severe pruritis after labor epidural. ? Epidural narcotic related. Had another labor epidural and did not have pruritis  Reviewed: Allergy & Precautions, NPO status , Patient's Chart, lab work & pertinent test results, reviewed documented beta blocker date and time   History of Anesthesia Complications (+) history of anesthetic complications  Airway Mallampati: I  TM Distance: >3 FB Neck ROM: Full    Dental no notable dental hx. (+) Teeth Intact   Pulmonary neg pulmonary ROS,    Pulmonary exam normal breath sounds clear to auscultation       Cardiovascular hypertension, Pt. on medications and Pt. on home beta blockers Normal cardiovascular exam Rhythm:Regular Rate:Normal     Neuro/Psych PSYCHIATRIC DISORDERS Anxiety Depression negative neurological ROS     GI/Hepatic negative GI ROS, Neg liver ROS,   Endo/Other  Morbid obesity  Renal/GU negative Renal ROS  negative genitourinary   Musculoskeletal  (+) Arthritis , Osteoarthritis,  OA left knee   Abdominal (+) + obese,   Peds  Hematology negative hematology ROS (+)   Anesthesia Other Findings   Reproductive/Obstetrics                            Anesthesia Physical Anesthesia Plan  ASA: III  Anesthesia Plan: Spinal   Post-op Pain Management:  Regional for Post-op pain   Induction: Intravenous  PONV Risk Score and Plan: 4 or greater and Scopolamine patch - Pre-op, Midazolam, Ondansetron, Dexamethasone and Treatment may vary due to age or medical condition  Airway Management Planned: Natural Airway and Simple Face Mask  Additional Equipment:   Intra-op Plan:   Post-operative Plan:   Informed Consent: I have reviewed the patients History and Physical, chart, labs and discussed the procedure including the risks,  benefits and alternatives for the proposed anesthesia with the patient or authorized representative who has indicated his/her understanding and acceptance.     Dental advisory given  Plan Discussed with: CRNA and Surgeon  Anesthesia Plan Comments:        Anesthesia Quick Evaluation

## 2018-12-01 NOTE — Anesthesia Postprocedure Evaluation (Signed)
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  Procedure(s) Performed: TOTAL KNEE ARTHROPLASTY (Left )     Patient location during evaluation: PACU Anesthesia Type: Spinal Level of consciousness: oriented and awake and alert Pain management: pain level controlled Vital Signs Assessment: post-procedure vital signs reviewed and stable Respiratory status: spontaneous breathing, respiratory function stable and nonlabored ventilation Cardiovascular status: blood pressure returned to baseline and stable Postop Assessment: no headache, no backache, no apparent nausea or vomiting, patient able to bend at knees and spinal receding Anesthetic complications: no    Last Vitals:  Vitals:   12/01/18 1315 12/01/18 1330  BP: 127/87 120/78  Pulse: (!) 52 66  Resp: 13 16  Temp:    SpO2: 99% 100%    Last Pain:  Vitals:   12/01/18 1300  TempSrc:   PainSc: 0-No pain                 Ketsia Linebaugh A.

## 2018-12-01 NOTE — Interval H&P Note (Signed)
History and Physical Interval Note:  12/01/2018 8:13 AM  Deborah Kennedy  has presented today for surgery, with the diagnosis of left knee osteoarthritis  The various methods of treatment have been discussed with the patient and family. After consideration of risks, benefits and other options for treatment, the patient has consented to  Procedure(s) with comments: TOTAL KNEE ARTHROPLASTY (Left) - 64min as a surgical intervention .  The patient's history has been reviewed, patient examined, no change in status, stable for surgery.  I have reviewed the patient's chart and labs.  Questions were answered to the patient's satisfaction.     Pilar Plate Hampton Cost

## 2018-12-01 NOTE — Transfer of Care (Signed)
Immediate Anesthesia Transfer of Care Note  Patient: Armed forces technical officer  Procedure(s) Performed: TOTAL KNEE ARTHROPLASTY (Left )  Patient Location: PACU  Anesthesia Type:Spinal and MAC combined with regional for post-op pain  Level of Consciousness: awake, alert  and oriented  Airway & Oxygen Therapy: Patient Spontanous Breathing and Patient connected to face mask oxygen  Post-op Assessment: Report given to RN and Post -op Vital signs reviewed and stable  Post vital signs: Reviewed and stable  Last Vitals:  Vitals Value Taken Time  BP    Temp    Pulse 74 12/01/2018 12:05 PM  Resp 17 12/01/2018 12:05 PM  SpO2 100 % 12/01/2018 12:05 PM  Vitals shown include unvalidated device data.  Last Pain:  Vitals:   12/01/18 1015  TempSrc:   PainSc: 0-No pain         Complications: No apparent anesthesia complications

## 2018-12-01 NOTE — Discharge Instructions (Signed)
° °Dr. Frank Aluisio °Total Joint Specialist °[facility] °[address] °[place], [place] [address] °[phone] ° °TOTAL KNEE REPLACEMENT POSTOPERATIVE DIRECTIONS ° °Knee Rehabilitation, Guidelines Following Surgery  °Results after knee surgery are often greatly improved when you follow the exercise, range of motion and muscle strengthening exercises prescribed by your doctor. Safety measures are also important to protect the knee from further injury. Any time any of these exercises cause you to have increased pain or swelling in your knee joint, decrease the amount until you are comfortable again and slowly increase them. If you have problems or questions, call your caregiver or physical therapist for advice.  ° °HOME CARE INSTRUCTIONS  °• Remove items at home which could result in a fall. This includes throw rugs or furniture in walking pathways.  °· ICE to the affected knee every three hours for 30 minutes at a time and then as needed for pain and swelling.  Continue to use ice on the knee for pain and swelling from surgery. You may notice swelling that will progress down to the foot and ankle.  This is normal after surgery.  Elevate the leg when you are not up walking on it.   °· Continue to use the breathing machine which will help keep your temperature down.  It is common for your temperature to cycle up and down following surgery, especially at night when you are not up moving around and exerting yourself.  The breathing machine keeps your lungs expanded and your temperature down. °· Do not place pillow under knee, focus on keeping the knee straight while resting ° °DIET °You may resume your previous home diet once your are discharged from the hospital. ° °DRESSING / WOUND CARE / SHOWERING °You may shower 3 days after surgery, but keep the wounds dry during showering.  You may use an occlusive plastic wrap (Press'n Seal for example), NO SOAKING/SUBMERGING IN THE BATHTUB.  If the bandage  gets wet, change with a clean dry gauze.  If the incision gets wet, pat the wound dry with a clean towel. °You may start showering once you are discharged home but do not submerge the incision under water. Just pat the incision dry and apply a dry gauze dressing on daily. °Change the surgical dressing daily and reapply a dry dressing each time. ° °ACTIVITY °Walk with your walker as instructed. °Use walker as long as suggested by your caregivers. °Avoid periods of inactivity such as sitting longer than an hour when not asleep. This helps prevent blood clots.  °You may resume a sexual relationship in one month or when given the OK by your doctor.  °You may return to work once you are cleared by your doctor.  °Do not drive a car for 6 weeks or until released by you surgeon.  °Do not drive while taking narcotics. ° °WEIGHT BEARING °Weight bearing as tolerated with assist device (walker, cane, etc) as directed, use it as long as suggested by your surgeon or therapist, typically at least 4-6 weeks. ° °POSTOPERATIVE CONSTIPATION PROTOCOL °Constipation - defined medically as fewer than three stools per week and severe constipation as less than one stool per week. ° °One of the most common issues patients have following surgery is constipation.  Even if you have a regular bowel pattern at home, your normal regimen is likely to be disrupted due to multiple reasons following surgery.  Combination of anesthesia, postoperative narcotics, change in appetite and fluid intake all can affect your bowels.    In order to avoid complications following surgery, here are some recommendations in order to help you during your recovery period. ° °Colace (docusate) - Pick up an over-the-counter form of Colace or another stool softener and take twice a day as long as you are requiring postoperative pain medications.  Take with a full glass of water daily.  If you experience loose stools or diarrhea, hold the colace until you stool forms back  up.  If your symptoms do not get better within 1 week or if they get worse, check with your doctor. ° °Dulcolax (bisacodyl) - Pick up over-the-counter and take as directed by the product packaging as needed to assist with the movement of your bowels.  Take with a full glass of water.  Use this product as needed if not relieved by Colace only.  ° °MiraLax (polyethylene glycol) - Pick up over-the-counter to have on hand.  MiraLax is a solution that will increase the amount of water in your bowels to assist with bowel movements.  Take as directed and can mix with a glass of water, juice, soda, coffee, or tea.  Take if you go more than two days without a movement. °Do not use MiraLax more than once per day. Call your doctor if you are still constipated or irregular after using this medication for 7 days in a row. ° °If you continue to have problems with postoperative constipation, please contact the office for further assistance and recommendations.  If you experience "the worst abdominal pain ever" or develop nausea or vomiting, please contact the office immediatly for further recommendations for treatment. ° °ITCHING ° If you experience itching with your medications, try taking only a single pain pill, or even half a pain pill at a time.  You can also use Benadryl over the counter for itching or also to help with sleep.  ° °TED HOSE STOCKINGS °Wear the elastic stockings on both legs for three weeks following surgery during the day but you may remove then at night for sleeping. ° °MEDICATIONS °See your medication summary on the “After Visit Summary” that the nursing staff will review with you prior to discharge.  You may have some home medications which will be placed on hold until you complete the course of blood thinner medication.  It is important for you to complete the blood thinner medication as prescribed by your surgeon.  Continue your approved medications as instructed at time of discharge. ° °PRECAUTIONS °If  you experience chest pain or shortness of breath - call 911 immediately for transfer to the hospital emergency department.  °If you develop a fever greater that 101 F, purulent drainage from wound, increased redness or drainage from wound, foul odor from the wound/dressing, or calf pain - CONTACT YOUR SURGEON.   °                                                °FOLLOW-UP APPOINTMENTS °Make sure you keep all of your appointments after your operation with your surgeon and caregivers. You should call the office at the above phone number and make an appointment for approximately two weeks after the date of your surgery or on the date instructed by your surgeon outlined in the "After Visit Summary". ° ° °RANGE OF MOTION AND STRENGTHENING EXERCISES  °Rehabilitation of the knee is important following a knee injury or   an operation. After just a few days of immobilization, the muscles of the thigh which control the knee become weakened and shrink (atrophy). Knee exercises are designed to build up the tone and strength of the thigh muscles and to improve knee motion. Often times heat used for twenty to thirty minutes before working out will loosen up your tissues and help with improving the range of motion but do not use heat for the first two weeks following surgery. These exercises can be done on a training (exercise) mat, on the floor, on a table or on a bed. Use what ever works the best and is most comfortable for you Knee exercises include:  °• Leg Lifts - While your knee is still immobilized in a splint or cast, you can do straight leg raises. Lift the leg to 60 degrees, hold for 3 sec, and slowly lower the leg. Repeat 10-20 times 2-3 times daily. Perform this exercise against resistance later as your knee gets better.  °• Quad and Hamstring Sets - Tighten up the muscle on the front of the thigh (Quad) and hold for 5-10 sec. Repeat this 10-20 times hourly. Hamstring sets are done by pushing the foot backward against an  object and holding for 5-10 sec. Repeat as with quad sets.  °· Leg Slides: Lying on your back, slowly slide your foot toward your buttocks, bending your knee up off the floor (only go as far as is comfortable). Then slowly slide your foot back down until your leg is flat on the floor again. °· Angel Wings: Lying on your back spread your legs to the side as far apart as you can without causing discomfort.  °A rehabilitation program following serious knee injuries can speed recovery and prevent re-injury in the future due to weakened muscles. Contact your doctor or a physical therapist for more information on knee rehabilitation.  ° °IF YOU ARE TRANSFERRED TO A SKILLED REHAB FACILITY °If the patient is transferred to a skilled rehab facility following release from the hospital, a list of the current medications will be sent to the facility for the patient to continue.  When discharged from the skilled rehab facility, please have the facility set up the patient's Home Health Physical Therapy prior to being released. Also, the skilled facility will be responsible for providing the patient with their medications at time of release from the facility to include their pain medication, the muscle relaxants, and their blood thinner medication. If the patient is still at the rehab facility at time of the two week follow up appointment, the skilled rehab facility will also need to assist the patient in arranging follow up appointment in our office and any transportation needs. ° °MAKE SURE YOU:  °• Understand these instructions.  °• Get help right away if you are not doing well or get worse.  ° ° °Pick up stool softner and laxative for home use following surgery while on pain medications. °Do not submerge incision under water. °Please use good hand washing techniques while changing dressing each day. °May shower starting three days after surgery. °Please use a clean towel to pat the incision dry following showers. °Continue to  use ice for pain and swelling after surgery. °Do not use any lotions or creams on the incision until instructed by your surgeon. ° °

## 2018-12-02 ENCOUNTER — Encounter (HOSPITAL_COMMUNITY): Payer: Self-pay | Admitting: Orthopedic Surgery

## 2018-12-02 LAB — CBC
HCT: 34.8 % — ABNORMAL LOW (ref 36.0–46.0)
Hemoglobin: 10.8 g/dL — ABNORMAL LOW (ref 12.0–15.0)
MCH: 27.8 pg (ref 26.0–34.0)
MCHC: 31 g/dL (ref 30.0–36.0)
MCV: 89.7 fL (ref 80.0–100.0)
Platelets: 179 10*3/uL (ref 150–400)
RBC: 3.88 MIL/uL (ref 3.87–5.11)
RDW: 12.8 % (ref 11.5–15.5)
WBC: 7.3 10*3/uL (ref 4.0–10.5)
nRBC: 0 % (ref 0.0–0.2)

## 2018-12-02 LAB — BASIC METABOLIC PANEL
Anion gap: 8 (ref 5–15)
BUN: 14 mg/dL (ref 6–20)
CHLORIDE: 109 mmol/L (ref 98–111)
CO2: 21 mmol/L — ABNORMAL LOW (ref 22–32)
CREATININE: 0.78 mg/dL (ref 0.44–1.00)
Calcium: 8.7 mg/dL — ABNORMAL LOW (ref 8.9–10.3)
GFR calc Af Amer: >60 mL/min (ref >60)
GFR calc non Af Amer: >60 mL/min (ref >60)
Glucose, Bld: 144 mg/dL — ABNORMAL HIGH (ref 70–99)
Potassium: 4.8 mmol/L (ref 3.5–5.1)
Sodium: 138 mmol/L (ref 135–145)

## 2018-12-02 MED ORDER — TRAMADOL HCL 50 MG PO TABS: 50.0000 mg | ORAL_TABLET | Freq: Four times a day (QID) | ORAL | 0 refills | Status: DC | PRN

## 2018-12-02 MED ORDER — GABAPENTIN 300 MG PO CAPS: 300.0000 mg | ORAL_CAPSULE | Freq: Three times a day (TID) | ORAL | 0 refills | Status: DC

## 2018-12-02 MED ORDER — OXYCODONE HCL 5 MG PO TABS: 5.0000 mg | ORAL_TABLET | Freq: Four times a day (QID) | ORAL | 0 refills | Status: DC | PRN

## 2018-12-02 MED ORDER — METHOCARBAMOL 500 MG PO TABS: 500.0000 mg | ORAL_TABLET | Freq: Four times a day (QID) | ORAL | 0 refills | Status: DC | PRN

## 2018-12-02 MED ORDER — ASPIRIN 325 MG PO TBEC: 325.0000 mg | DELAYED_RELEASE_TABLET | Freq: Two times a day (BID) | ORAL | 0 refills | Status: AC

## 2018-12-02 NOTE — Progress Notes (Signed)
   Subjective: 1 Day Post-Op Procedure(s) (LRB): TOTAL KNEE ARTHROPLASTY (Left) Patient reports pain as mild.   Patient seen in rounds by Dr. Wynelle Link. Patient is well, and has had no acute complaints or problems other than pain in the left knee. Denies chest pain, SOB, or calf pain. Foley catheter removed this AM. States she is ready to go home, no issues overnight.  We will start therapy today.   Objective: Vital signs in last 24 hours: Temp:  [94.5 F (34.7 C)-98.6 F (37 C)] 97.6 F (36.4 C) (02/04 0541) Pulse Rate:  [48-76] 53 (02/04 0541) Resp:  [13-21] 18 (02/04 0541) BP: (105-156)/(59-101) 105/68 (02/04 0541) SpO2:  [95 %-100 %] 99 % (02/04 0541) Weight:  [109.5 kg] 109.5 kg (02/03 1616)  Intake/Output from previous day:  Intake/Output Summary (Last 24 hours) at 12/02/2018 0732 Last data filed at 12/02/2018 0600 Gross per 24 hour  Intake 3948.07 ml  Output 2662.5 ml  Net 1285.57 ml    Labs: Recent Labs    12/02/18 0707  HGB 10.8*   Recent Labs    12/02/18 0707  WBC 7.3  RBC 3.88  HCT 34.8*  PLT 179   Recent Labs    12/02/18 0522  NA 138  K 4.8  CL 109  CO2 21*  BUN 14  CREATININE 0.78  GLUCOSE 144*  CALCIUM 8.7*   Exam: General - Patient is Alert and Oriented Extremity - Neurologically intact Neurovascular intact Sensation intact distally Dorsiflexion/Plantar flexion intact Dressing - dressing C/D/I Motor Function - intact, moving foot and toes well on exam.   Past Medical History:  Diagnosis Date  . Acute medial meniscus tear, right, subsequent encounter   . Anxiety   . Complication of anesthesia    reaction to epidural medication  . Depression   . Hypertension     Assessment/Plan: 1 Day Post-Op Procedure(s) (LRB): TOTAL KNEE ARTHROPLASTY (Left) Principal Problem:   OA (osteoarthritis) of knee  Estimated body mass index is 41.43 kg/m as calculated from the following:   Height as of this encounter: 5\' 4"  (1.626 m).   Weight as of  this encounter: 109.5 kg. Advance diet Up with therapy D/C IV fluids  Anticipated LOS equal to or greater than 2 midnights due to - Age 56 and older with one or more of the following:  - Obesity  - Expected need for hospital services (PT, OT, Nursing) required for safe  discharge  - Anticipated need for postoperative skilled nursing care or inpatient rehab  - Active co-morbidities: None OR   - Unanticipated findings during/Post Surgery: None  - Patient is a high risk of re-admission due to: None    DVT Prophylaxis - Aspirin Weight bearing as tolerated. D/C O2 and pulse ox and try on room air. Hemovac pulled without difficulty, will continue therapy today.  Plan is to go Home after hospital stay. Plan for discharge later today if meeting goals. Scheduled for outpatient therapy at [facility] in [place]. Follow-up in the office in 2 weeks.   Theresa Duty, PA-C Orthopedic Surgery 12/02/2018, 7:32 AM

## 2018-12-02 NOTE — Progress Notes (Signed)
Physical Therapy Treatment Patient Details Name: Deborah Kennedy MRN: [id number] DOB: 07/11/1961 Today's Date: 12/02/2018    History of Present Illness S/P L TKA     PT Comments    The patient is progressing well. Reports more thigh pain than knee  Patient's spouse and patient request  That patient receive HHPT initially due to concern for having stairs to negotiate for each trip to OPPT.  Spouse will only be avaialble x 1 week to assist.   continue gait training on stairs next visit. Plans DC tomorrow.  Follow Up Recommendations  Follow surgeon's recommendation for DC plan and follow-up therapies Recommend HHPT due to multiple stairs.     Equipment Recommendations  Rolling walker with 5" wheels;3in1 (PT)    Recommendations for Other Services       Precautions / Restrictions Precautions Precautions: Knee;Fall Precaution Comments: did not use KI Restrictions LLE Weight Bearing: Weight bearing as tolerated    Mobility  Bed Mobility   Bed Mobility: Supine to Sit     Supine to sit: Supervision     General bed mobility comments: OOB  Transfers Overall transfer level: Needs assistance Equipment used: Rolling walker (2 wheeled) Transfers: Sit to/from Stand Sit to Stand: Min guard         General transfer comment: cues for hand and left leg position  Ambulation/Gait Ambulation/Gait assistance: Min assist Gait Distance (Feet): 200 Feet Assistive device: Rolling walker (2 wheeled) Gait Pattern/deviations: Step-to pattern;Step-through pattern     General Gait Details: cues to roll the RW.   Stairs             Wheelchair Mobility    Modified Rankin (Stroke Patients Only)       Balance                                            Cognition Arousal/Alertness: Awake/alert                                     General Comments: patient is very quiet      Exercises Total Joint Exercises Ankle Circles/Pumps:  AROM;10 reps;Both Quad Sets: AROM;Both;10 reps Towel Squeeze: AAROM;Left;10 reps Heel Slides: AAROM;Left;10 reps Hip ABduction/ADduction: AAROM;Left;10 reps Straight Leg Raises: AAROM;Left;10 reps Long Arc Quad: AAROM;Left;10 reps;Seated Knee Flexion: AAROM;Left;10 reps;Seated Goniometric ROM: 10-50 knee flexion    General Comments        Pertinent Vitals/Pain Pain Score: 5  Pain Location: left thigh, knee with mobility after ambulation Pain Descriptors / Indicators: Aching;Discomfort;Grimacing Pain Intervention(s): Ice applied;Heat applied;Monitored during session;Premedicated before session;Repositioned    Home Living                      Prior Function            PT Goals (current goals can now be found in the care plan section) Progress towards PT goals: Progressing toward goals    Frequency    7X/week      PT Plan Current plan remains appropriate    Co-evaluation              AM-PAC PT "6 Clicks" Mobility   Outcome Measure  Help needed turning from your back to your side while in a flat bed without using bedrails?: A Little Help  needed moving from lying on your back to sitting on the side of a flat bed without using bedrails?: A Little Help needed moving to and from a bed to a chair (including a wheelchair)?: A Little Help needed standing up from a chair using your arms (e.g., wheelchair or bedside chair)?: A Little Help needed to walk in hospital room?: A Little Help needed climbing 3-5 steps with a railing? : A Lot 6 Click Score: 17    End of Session Equipment Utilized During Treatment: Gait belt Activity Tolerance: Patient tolerated treatment well Patient left: in chair;with call bell/phone within reach;with family/visitor present Nurse Communication: Mobility status PT Visit Diagnosis: Unsteadiness on feet (R26.81);Pain Pain - Right/Left: Left Pain - part of body: Knee     Time: 0459-9774 PT Time Calculation (min) (ACUTE ONLY):  26 min  Charges:  Gait 8-22 Therap. exer [place] Pager [phone] Office [phone] n     Claretha Cooper 12/02/2018, 4:53 PM

## 2018-12-02 NOTE — Plan of Care (Signed)
  Problem: Clinical Measurements: Goal: Will remain free from infection Outcome: Progressing   Problem: Clinical Measurements: Goal: Diagnostic test results will improve Outcome: Progressing   Problem: Clinical Measurements: Goal: Respiratory complications will improve Outcome: Progressing   Problem: Clinical Measurements: Goal: Cardiovascular complication will be avoided Outcome: Progressing   Problem: Activity: Goal: Risk for activity intolerance will decrease Outcome: Progressing   

## 2018-12-02 NOTE — Evaluation (Signed)
Occupational Therapy Evaluation Patient Details Name: Deborah Kennedy MRN: [id number] DOB: 1961/02/05 Today's Date: 12/02/2018    History of Present Illness S/P L TKA    Clinical Impression   This 58 y/o female presents with the above. At baseline pt is independent with ADL and functional mobility. Pt mostly limited this session due to post-op pain and weakness. She completed room level functional mobility using RW with minA; currently requires setup assist for seated UB ADL, min-modA for LB ADL. Pt will benefit from continued acute OT services to maximize her safety and independence with ADL and mobility prior to return home. Pt reports will return home with assist from spouse/daugthers for ADL/iADL needs. Will continue to follow.     Follow Up Recommendations  Supervision/Assistance - 24 hour;Follow surgeon's recommendation for DC plan and follow-up therapies    Equipment Recommendations  3 in 1 bedside commode           Precautions / Restrictions Precautions Precautions: Knee;Fall Required Braces or Orthoses: Knee Immobilizer - Left Knee Immobilizer - Left: Discontinue once straight leg raise with < 10 degree lag Restrictions Weight Bearing Restrictions: Yes LLE Weight Bearing: Weight bearing as tolerated      Mobility Bed Mobility Overal bed mobility: Needs Assistance Bed Mobility: Supine to Sit;Sit to Supine     Supine to sit: Min assist Sit to supine: Min assist   General bed mobility comments: cues for technique; HOB elevated, minA for LLE  Transfers Overall transfer level: Needs assistance Equipment used: Rolling walker (2 wheeled) Transfers: Sit to/from Stand Sit to Stand: Min guard         General transfer comment: cues for hand and left leg position    Balance                                           ADL either performed or assessed with clinical judgement   ADL Overall ADL's : Needs assistance/impaired Eating/Feeding:  Independent;Sitting   Grooming: Set up;Sitting   Upper Body Bathing: Min guard;Sitting   Lower Body Bathing: Minimal assistance;Sit to/from stand Lower Body Bathing Details (indicate cue type and reason): educated on use of AE for LB bathing task Upper Body Dressing : Set up;Sitting Upper Body Dressing Details (indicate cue type and reason): donning robe start of session in sitting Lower Body Dressing: Moderate assistance;Sit to/from stand Lower Body Dressing Details (indicate cue type and reason): educated in compensatory technique for performing ADL; minA standing balance Toilet Transfer: Minimal assistance;Ambulation;RW Toilet Transfer Details (indicate cue type and reason): simulated in transfer to/from EOB Toileting- Clothing Manipulation and Hygiene: Minimal assistance;Sit to/from Nurse, children's Details (indicate cue type and reason): verbally reviewed walk-in shower transfer and handout issued; educated on use of seat in shower to increase safety/independence wtih task completion Functional mobility during ADLs: Minimal assistance;Rolling walker       Vision         Perception     Praxis      Pertinent Vitals/Pain Pain Assessment: 0-10 Pain Score: 7  Pain Location: left thigh, knee with mobility Pain Descriptors / Indicators: Aching;Discomfort;Grimacing Pain Intervention(s): Monitored during session;Premedicated before session;Repositioned     Hand Dominance     Extremity/Trunk Assessment Upper Extremity Assessment Upper Extremity Assessment: Overall WFL for tasks assessed   Lower Extremity Assessment Lower Extremity Assessment: Defer to PT evaluation  Communication Communication Communication: No difficulties   Cognition Arousal/Alertness: Awake/alert Behavior During Therapy: WFL for tasks assessed/performed Overall Cognitive Status: Within Functional Limits for tasks assessed                                 General  Comments: patient is very quiet   General Comments       Exercises     Shoulder Instructions      Home Living Family/patient expects to be discharged to:: Private residence Living Arrangements: Spouse/significant other Available Help at Discharge: Family Type of Home: House Home Access: Stairs to enter [employer] of Steps: 6 Entrance Stairs-Rails: Right;Left Home Layout: One level     Bathroom Shower/Tub: Tub/shower unit;Walk-in shower   Bathroom Toilet: Standard     Home Equipment: Crutches          Prior Functioning/Environment Level of Independence: Independent with assistive device(s)                 OT Problem List: Decreased strength;Decreased activity tolerance;Decreased range of motion;Impaired balance (sitting and/or standing);Decreased knowledge of use of DME or AE;Pain      OT Treatment/Interventions: Self-care/ADL training;Therapeutic exercise;Neuromuscular education;DME and/or AE instruction;Therapeutic activities;Patient/family education;Balance training    OT Goals(Current goals can be found in the care plan section) Acute Rehab OT Goals Patient Stated Goal: to go home OT Goal Formulation: With patient Time For Goal Achievement: 12/16/18 Potential to Achieve Goals: Good  OT Frequency: Min 2X/week   Barriers to D/C:            Co-evaluation              AM-PAC OT "6 Clicks" Daily Activity     Outcome Measure Help from another person eating meals?: None Help from another person taking care of personal grooming?: None(in sitting) Help from another person toileting, which includes using toliet, bedpan, or urinal?: A Little Help from another person bathing (including washing, rinsing, drying)?: A Little Help from another person to put on and taking off regular upper body clothing?: None Help from another person to put on and taking off regular lower body clothing?: A Lot 6 Click Score: 20   End of Session Equipment  Utilized During Treatment: Gait belt;Left knee immobilizer;Rolling walker Nurse Communication: Mobility status  Activity Tolerance: Patient tolerated treatment well;Patient limited by pain Patient left: in bed;with call bell/phone within reach;with bed alarm set  OT Visit Diagnosis: Unsteadiness on feet (R26.81);Pain Pain - Right/Left: Left Pain - part of body: Knee                Time: 1448-1856 OT Time Calculation (min): 22 min Charges:  OT General Charges $OT Visit: 1 Visit OT Evaluation $OT Eval Low Complexity: [place], OT [facility] Pager [phone] Office [phone]   Raymondo Band 12/02/2018, 9:42 AM

## 2018-12-02 NOTE — Progress Notes (Signed)
Physical Therapy Treatment Patient Details Name: Deborah Kennedy MRN: [id number] DOB: 06-06-1961 Today's Date: 12/02/2018    History of Present Illness S/P L TKA     PT Comments    Patient progressing with mobility. Reported feeling weak after ambulation and thigh pain. /requested a heat pack. Ice placed on the knee. Patient has multiple steps to enter home. May be a challenge to perform today. .  Follow Up Recommendations  Follow surgeon's recommendation for DC plan and follow-up therapies     Equipment Recommendations  Rolling walker with 5" wheels;3in1 (PT)    Recommendations for Other Services       Precautions / Restrictions Precautions Precautions: Knee;Fall Precaution Comments: did not use KI Required Braces or Orthoses: Knee Immobilizer - Left Knee Immobilizer - Left: Discontinue once straight leg raise with < 10 degree lag Restrictions Weight Bearing Restrictions: No LLE Weight Bearing: Weight bearing as tolerated    Mobility  Bed Mobility Overal bed mobility: Needs Assistance Bed Mobility: Supine to Sit     Supine to sit: Supervision Sit to supine: Min assist   General bed mobility comments: cues for technique; HOB elevated, minA for LLE  Transfers Overall transfer level: Needs assistance Equipment used: Rolling walker (2 wheeled) Transfers: Sit to/from Stand Sit to Stand: Min guard         General transfer comment: cues for hand and left leg position  Ambulation/Gait Ambulation/Gait assistance: Min assist Gait Distance (Feet): 90 Feet Assistive device: Rolling walker (2 wheeled) Gait Pattern/deviations: Step-to pattern;Step-through pattern     General Gait Details: patient reports feeling weak when near end of walking.  C/o increased pain.   Stairs             Wheelchair Mobility    Modified Rankin (Stroke Patients Only)       Balance                                            Cognition  Arousal/Alertness: Awake/alert Behavior During Therapy: WFL for tasks assessed/performed Overall Cognitive Status: Within Functional Limits for tasks assessed                                 General Comments: patient is very quiet      Exercises Total Joint Exercises Ankle Circles/Pumps: AROM;10 reps;Both Quad Sets: AROM;Both;10 reps Towel Squeeze: AAROM;Left;10 reps Heel Slides: AAROM;Left;10 reps Hip ABduction/ADduction: AAROM;Left;10 reps Straight Leg Raises: AAROM;Left;10 reps Goniometric ROM: 10-50 knee flexion    General Comments        Pertinent Vitals/Pain Pain Assessment: 0-10 Pain Score: 8  Pain Location: left thigh, knee with mobility after ambulation Pain Descriptors / Indicators: Aching;Discomfort;Grimacing Pain Intervention(s): Heat applied;Ice applied;Monitored during session;Premedicated before session;Patient requesting pain meds-RN notified    Home Living Family/patient expects to be discharged to:: Private residence Living Arrangements: Spouse/significant other Available Help at Discharge: Family Type of Home: House Home Access: Stairs to enter Entrance Stairs-Rails: Right;Left Home Layout: One level Home Equipment: Crutches      Prior Function Level of Independence: Independent with assistive device(s)          PT Goals (current goals can now be found in the care plan section) Acute Rehab PT Goals Patient Stated Goal: to go home Progress towards PT goals: Progressing toward goals  Frequency    7X/week      PT Plan Current plan remains appropriate    Co-evaluation              AM-PAC PT "6 Clicks" Mobility   Outcome Measure  Help needed turning from your back to your side while in a flat bed without using bedrails?: A Little Help needed moving from lying on your back to sitting on the side of a flat bed without using bedrails?: A Little Help needed moving to and from a bed to a chair (including a wheelchair)?:  A Little Help needed standing up from a chair using your arms (e.g., wheelchair or bedside chair)?: A Little Help needed to walk in hospital room?: A Little Help needed climbing 3-5 steps with a railing? : A Lot 6 Click Score: 17    End of Session Equipment Utilized During Treatment: Gait belt Activity Tolerance: Patient limited by pain Patient left: in chair;with call bell/phone within reach Nurse Communication: Mobility status(bleding through dressing from Hemavac removal) PT Visit Diagnosis: Unsteadiness on feet (R26.81);Pain Pain - Right/Left: Left Pain - part of body: Knee     Time: 0930-1009 PT Time Calculation (min) (ACUTE ONLY): 39 min  Charges:  $Gait Training: 8-22 mins $Therapeutic Exercise: 8-22 mins $Self Care/Home Management: [place] Pager [phone] Office [phone]    Claretha Cooper 12/02/2018, 1:02 PM

## 2018-12-02 NOTE — Care Management Note (Signed)
Case Management Note  Patient Details  Name: Deborah Kennedy MRN: [id number] Date of Birth: 1961/08/09  Subjective/Objective:    Spoke with patient at bedside. Confirmed plan for OP PT, already arranged. Needs a RW, contacted [facility] to deliver to the room. [phone]                Action/Plan:   Expected Discharge Date:  12/02/18               Expected Discharge Plan:  OP Rehab  In-House Referral:  NA  Discharge planning Services  CM Consult  Post Acute Care Choice:  Durable Medical Equipment Choice offered to:  Patient  DME Arranged:  [facility] rolling DME Agency:  [place]:  NA [place] Agency:  NA  Status of Service:  Completed, signed off  If discussed at [place] of Stay Meetings, dates discussed:    Additional Comments:  Guadalupe Maple, RN 12/02/2018, 11:38 AM

## 2018-12-03 LAB — BASIC METABOLIC PANEL
Anion gap: 8 (ref 5–15)
BUN: 14 mg/dL (ref 6–20)
CO2: 24 mmol/L (ref 22–32)
Calcium: 9.2 mg/dL (ref 8.9–10.3)
Chloride: 108 mmol/L (ref 98–111)
Creatinine, Ser: 0.67 mg/dL (ref 0.44–1.00)
GFR calc Af Amer: >60 mL/min (ref >60)
GLUCOSE: 125 mg/dL — AB (ref 70–99)
Potassium: 3.7 mmol/L (ref 3.5–5.1)
Sodium: 140 mmol/L (ref 135–145)

## 2018-12-03 LAB — CBC
HCT: 35.8 % — ABNORMAL LOW (ref 36.0–46.0)
Hemoglobin: 11 g/dL — ABNORMAL LOW (ref 12.0–15.0)
MCH: 27.2 pg (ref 26.0–34.0)
MCHC: 30.7 g/dL (ref 30.0–36.0)
MCV: 88.6 fL (ref 80.0–100.0)
Platelets: 171 10*3/uL (ref 150–400)
RBC: 4.04 MIL/uL (ref 3.87–5.11)
RDW: 12.8 % (ref 11.5–15.5)
WBC: 7.5 10*3/uL (ref 4.0–10.5)
nRBC: 0 % (ref 0.0–0.2)

## 2018-12-03 NOTE — Progress Notes (Signed)
Physical Therapy Treatment Patient Details Name: Deborah Kennedy MRN: [id number] DOB: [birth year] Today's Date: 12/03/2018    History of Present Illness S/P L TKA     PT Comments    Patient negotiated 4 steps well with crutch and a rail. Plabns Dc today after PT.   Follow Up Recommendations  Follow surgeon's recommendation for DC plan and follow-up therapies     Equipment Recommendations  Rolling walker with 5" wheels;3in1 (PT)    Recommendations for Other Services       Precautions / Restrictions Precautions Precautions: Knee;Fall Precaution Comments: used for steps Required Braces or Orthoses: Knee Immobilizer - Left Restrictions Weight Bearing Restrictions: No LLE Weight Bearing: Weight bearing as tolerated    Mobility  Bed Mobility               General bed mobility comments: OOB  Transfers   Equipment used: Rolling walker (2 wheeled) Transfers: Sit to/from Stand Sit to Stand: Supervision            Ambulation/Gait Ambulation/Gait assistance: Min guard Gait Distance (Feet): 200 Feet Assistive device: Rolling walker (2 wheeled) Gait Pattern/deviations: Step-through pattern     General Gait Details: cues to roll the RW.   Stairs Stairs: Yes Stairs assistance: Min assist Stair Management: One rail Left;Forwards;With crutches Number of Stairs: 4 General stair comments: patient performed very well.    Wheelchair Mobility    Modified Rankin (Stroke Patients Only)       Balance                                            Cognition Arousal/Alertness: Awake/alert                                            Exercises      General Comments        Pertinent Vitals/Pain Pain Score: 5  Pain Location: left thigh, knee with mobility after ambulation Pain Descriptors / Indicators: Aching;Discomfort;Grimacing Pain Intervention(s): Monitored during session;Premedicated before session;Ice  applied;Heat applied    Home Living                      Prior Function            PT Goals (current goals can now be found in the care plan section) Progress towards PT goals: Progressing toward goals    Frequency           PT Plan Current plan remains appropriate    Co-evaluation              AM-PAC PT "6 Clicks" Mobility   Outcome Measure  Help needed turning from your back to your side while in a flat bed without using bedrails?: A Little Help needed moving from lying on your back to sitting on the side of a flat bed without using bedrails?: A Little Help needed moving to and from a bed to a chair (including a wheelchair)?: A Little   Help needed to walk in hospital room?: A Little Help needed climbing 3-5 steps with a railing? : A Little 6 Click Score: 15    End of Session Equipment Utilized During Treatment: Gait belt Activity Tolerance: Patient tolerated treatment well Patient left: in chair;with  call bell/phone within reach Nurse Communication: Mobility status PT Visit Diagnosis: Unsteadiness on feet (R26.81);Pain Pain - Right/Left: Left Pain - part of body: Knee     Time: 7001-7494 PT Time Calculation (min) (ACUTE ONLY): 18 min  Charges:  $Gait Training: 8-22 mins                     [place] Pager [phone] Office [phone]    Claretha Cooper 12/03/2018, 11:06 AM

## 2018-12-03 NOTE — Progress Notes (Signed)
   Subjective: 2 Days Post-Op Procedure(s) (LRB): TOTAL KNEE ARTHROPLASTY (Left) Patient reports pain as mild.   Patient seen in rounds with Dr. Wynelle Link. Patient is well, and has had no acute complaints or problems. States she is ready to go home. Voiding without difficulty and positive flatus. No issues overnight. Denies chest pain, SOB, or calf pain. Plan is to go Home after hospital stay.  Objective: Vital signs in last 24 hours: Temp:  [97.7 F (36.5 C)-98.7 F (37.1 C)] 98.4 F (36.9 C) (02/05 0520) Pulse Rate:  [52-69] 69 (02/05 0520) Resp:  [15-17] 16 (02/05 0520) BP: (133-154)/(72-82) 138/82 (02/05 0520) SpO2:  [96 %-100 %] 96 % (02/05 0520)  Intake/Output from previous day:  Intake/Output Summary (Last 24 hours) at 12/03/2018 0728 Last data filed at 12/03/2018 0525 Gross per 24 hour  Intake 834.03 ml  Output 1200 ml  Net -365.97 ml    Labs: Recent Labs    12/02/18 0707 12/03/18 0541  HGB 10.8* 11.0*   Recent Labs    12/02/18 0707 12/03/18 0541  WBC 7.3 7.5  RBC 3.88 4.04  HCT 34.8* 35.8*  PLT 179 171   Recent Labs    12/02/18 0522 12/03/18 0541  NA 138 140  K 4.8 3.7  CL 109 108  CO2 21* 24  BUN 14 14  CREATININE 0.78 0.67  GLUCOSE 144* 125*  CALCIUM 8.7* 9.2   Exam: General - Patient is Alert and Oriented Extremity - Neurologically intact Neurovascular intact Sensation intact distally Dorsiflexion/Plantar flexion intact Dressing/Incision - clean, dry, and no drainage Motor Function - intact, moving foot and toes well on exam.   Past Medical History:  Diagnosis Date  . Acute medial meniscus tear, right, subsequent encounter   . Anxiety   . Complication of anesthesia    reaction to epidural medication  . Depression   . Hypertension     Assessment/Plan: 2 Days Post-Op Procedure(s) (LRB): TOTAL KNEE ARTHROPLASTY (Left) Principal Problem:   OA (osteoarthritis) of knee  Estimated body mass index is 41.43 kg/m as calculated from the  following:   Height as of this encounter: 5\' 4"  (1.626 m).   Weight as of this encounter: 109.5 kg. Up with therapy D/C IV fluids  DVT Prophylaxis - Aspirin Weight-bearing as tolerated  Plan for discharge to home today once passes therapy with stairs. Ordered HHPT due to patient's concern that she has 6 stairs to get into her home, then she will complete OP PT at [facility] in [place]. Follow-up in the office in 2 weeks.   Theresa Duty, PA-C Orthopedic Surgery 12/03/2018, 7:28 AM

## 2018-12-03 NOTE — Progress Notes (Signed)
Physical Therapy Treatment Patient Details Name: Deborah Kennedy MRN: [id number] DOB: Aug 10, 1961 Today's Date: 12/03/2018    History of Present Illness S/P L TKA     PT Comments    Patient is ready for DC.sign   Follow Up Recommendations  Home health PT     Equipment Recommendations  Rolling walker with 5" wheels;3in1 (PT)    Recommendations for Other Services       Precautions / Restrictions Precautions Precautions: Knee;Fall Precaution Comments: used for steps Required Braces or Orthoses: Knee Immobilizer - Left    Mobility  Bed Mobility                Wheelchair Mobility    Modified Rankin (Stroke Patients Only)       Balance                                            Cognition Arousal/Alertness: Awake/alert                                            Exercises Total Joint Exercises Ankle Circles/Pumps: AROM;10 reps;Both Quad Sets: AROM;Both;10 reps Short Arc QuadSinclair Ship;Left;10 reps;Supine Heel Slides: AAROM;Left;10 reps Hip ABduction/ADduction: AAROM;Left;10 reps Straight Leg Raises: AAROM;Left;10 reps Long Arc Quad: AAROM;Left;10 reps;Seated Knee Flexion: AAROM;Left;10 reps;Seated Goniometric ROM: 10-60 left knee flexion    General Comments        Pertinent Vitals/Pain Pain Score: 3  Pain Location: left thigh and knee Pain Descriptors / Indicators: Aching;Discomfort;Grimacing Pain Intervention(s): Monitored during session;Premedicated before session;Heat applied;Ice applied    Home Living                      Prior Function            PT Goals (current goals can now be found in the care plan section) Progress towards PT goals: Progressing toward goals    Frequency    7X/week      PT Plan Current plan remains appropriate    Co-evaluation              AM-PAC PT "6 Clicks" Mobility   Outcome Measure  Help needed turning from your back to your side while in a  flat bed without using bedrails?: A Little Help needed moving from lying on your back to sitting on the side of a flat bed without using bedrails?: A Little Help needed moving to and from a bed to a chair (including a wheelchair)?: A Little Help needed standing up from a chair using your arms (e.g., wheelchair or bedside chair)?: A Little Help needed to walk in hospital room?: A Little Help needed climbing 3-5 steps with a railing? : A Little 6 Click Score: 18    End of Session Equipment Utilized During Treatment: Gait belt Activity Tolerance: Patient tolerated treatment well Patient left: in chair;with call bell/phone within reach Nurse Communication: Mobility status PT Visit Diagnosis: Unsteadiness on feet (R26.81);Pain Pain - Right/Left: Left Pain - part of body: Knee     Time: 1125-1145 PT Time Calculation (min) (ACUTE ONLY): 20 min  Charges:  $Therapeutic exercises.: 8-22 mins                     Tresa Endo PT  Acute Rehabilitation Services Pager [phone] Office [phone]    Claretha Cooper 12/03/2018, 1:13 PM

## 2018-12-03 NOTE — Plan of Care (Signed)
  Problem: Elimination: Goal: Will not experience complications related to bowel motility Outcome: Progressing   Problem: Activity: Goal: Risk for activity intolerance will decrease Outcome: Progressing   Problem: Clinical Measurements: Goal: Cardiovascular complication will be avoided Outcome: Progressing   Problem: Clinical Measurements: Goal: Respiratory complications will improve Outcome: Progressing   Problem: Clinical Measurements: Goal: Diagnostic test results will improve Outcome: Progressing

## 2018-12-03 NOTE — Progress Notes (Signed)
OT Cancellation Note  Patient Details Name: Deborah Kennedy MRN: [id number] DOB: Dec 25, 1960   Cancelled Treatment:    Reason Eval/Treat Not Completed: Other (comment)  Pt verbalized no questions regarding ADL activity.    Kari Baars, OT Acute Rehabilitation Services [phone] Office- [phone], Edwena Felty D 12/03/2018, 2:22 PM

## 2018-12-04 NOTE — Care Management (Signed)
Received a call from Ronalee Belts [facility] rep stating this patient should have had HHPT at d/c. Upon reviewing chart, HH was ordered after 5pm and no one notified CM to arrange. [facility] will f/u with patient and provide [place] per Ronalee Belts.

## 2018-12-05 ENCOUNTER — Other Ambulatory Visit: Payer: Self-pay | Admitting: *Deleted

## 2018-12-05 DIAGNOSIS — Z471 Aftercare following joint replacement surgery: Secondary | ICD-10-CM | POA: Diagnosis not present

## 2018-12-05 DIAGNOSIS — Z6830 Body mass index (BMI) 30.0-30.9, adult: Secondary | ICD-10-CM | POA: Diagnosis not present

## 2018-12-05 DIAGNOSIS — E669 Obesity, unspecified: Secondary | ICD-10-CM | POA: Diagnosis not present

## 2018-12-05 DIAGNOSIS — I10 Essential (primary) hypertension: Secondary | ICD-10-CM | POA: Diagnosis not present

## 2018-12-05 DIAGNOSIS — Z96652 Presence of left artificial knee joint: Secondary | ICD-10-CM | POA: Diagnosis not present

## 2018-12-05 DIAGNOSIS — Z981 Arthrodesis status: Secondary | ICD-10-CM | POA: Diagnosis not present

## 2018-12-05 NOTE — Patient Outreach (Signed)
[facility]) Care Management  12/05/2018  Deborah Kennedy [birth year] [id number]   Transition of care call   Referral received: 11/13/18 Initial outreach: 11/28/18 Insurance: [employer] Save Plan   Subjective: Initial successful telephone call to patient's preferred number in order to complete transition of care assessment; 2 HIPAA identifiers verified. Explained purpose of call and completed transition of care assessment.  Deborah Kennedy states she is doing well, rates her pain at level 5, states she will have her first home health physical therapy session today so encouraged her to premedicate.     Objective:  Deborah Deborah Kennedy was hospitalized at [facility] from 2/3-2/5 for left total knee arthroplasty. Comorbidities include: Obesity She was discharged to home on 12/03/18 with home health services of physical therapy to be provided by [facility]. DME of a rolling walker with 5 " wheels, crutches, and 3 in 1 provided to patient at discharge.    Assessment:  See transition of care flowsheet for assessment details.   Plan:  No ongoing care management needs identified so will close case to [place] [facility] care management services and route successful outreach letter with [place] [facility] pamphlet and 24 Hour Nurse Line Magnet to [place] [facility] clinical pool to be mailed to patient's home address.   Barrington Ellison RN,CCM,CDE [place] Management Coordinator Office Phone [phone] Office Fax [phone]

## 2018-12-06 ENCOUNTER — Emergency Department (HOSPITAL_COMMUNITY)
Admission: EM | Admit: 2018-12-06 | Discharge: 2018-12-07 | Disposition: A | Discharge status: Home / Self Care | Payer: 59 | Attending: Emergency Medicine | Admitting: Emergency Medicine

## 2018-12-06 ENCOUNTER — Other Ambulatory Visit: Payer: Self-pay

## 2018-12-06 ENCOUNTER — Encounter (HOSPITAL_COMMUNITY): Payer: Self-pay | Admitting: *Deleted

## 2018-12-06 DIAGNOSIS — Z79899 Other long term (current) drug therapy: Secondary | ICD-10-CM | POA: Diagnosis not present

## 2018-12-06 DIAGNOSIS — M25562 Pain in left knee: Secondary | ICD-10-CM | POA: Diagnosis not present

## 2018-12-06 DIAGNOSIS — I10 Essential (primary) hypertension: Secondary | ICD-10-CM | POA: Insufficient documentation

## 2018-12-06 MED ORDER — ONDANSETRON HCL 4 MG/2ML IJ SOLN: 4.0000 mg | Freq: Once | INTRAMUSCULAR | Status: DC

## 2018-12-06 MED ORDER — OXYCODONE-ACETAMINOPHEN 5-325 MG PO TABS
2.0000 | ORAL_TABLET | Freq: Once | ORAL | Status: DC
  Filled 2018-12-06: qty 2

## 2018-12-06 MED ORDER — ENOXAPARIN SODIUM 120 MG/0.8ML ~~LOC~~ SOLN
1.0000 mg/kg | Freq: Once | SUBCUTANEOUS | Status: AC
  Administered 2018-12-07: 110 mg via SUBCUTANEOUS
  Filled 2018-12-06: qty 0.72

## 2018-12-06 MED ORDER — ONDANSETRON 4 MG PO TBDP
4.0000 mg | ORAL_TABLET | Freq: Once | ORAL | Status: AC
  Administered 2018-12-07: 4 mg via ORAL
  Filled 2018-12-06: qty 1

## 2018-12-06 MED ORDER — HYDROMORPHONE HCL 1 MG/ML IJ SOLN: 1.0000 mg | Freq: Once | INTRAMUSCULAR | Status: DC

## 2018-12-06 NOTE — ED Notes (Signed)
Pt stated "I took tramodol around 1600 and robaxin 500 mg around 2100."

## 2018-12-06 NOTE — ED Triage Notes (Signed)
Pt c/o increasing pain to left knee.  Total knee replacement 2/3 by Dr. Maureen Ralphs.

## 2018-12-06 NOTE — ED Provider Notes (Signed)
TIME SEEN: 11:37 PM  CHIEF COMPLAINT: Left calf pain  HPI: Patient is a 58 year old female with history of hypertension, depression, anxiety who recently underwent left total knee arthroplasty by Dr. Maureen Ralphs on 12/01/2018 who presents to the emergency department with increased pain in the left knee and the posterior left calf.  She is on aspirin currently but no anticoagulation.  No previous history of DVT, PE.  Denies chest pain, shortness of breath.  States she has had chills at home but no fever.  Has been taking oxycodone, Tylenol, tramadol, Robaxin for pain relief.  Took tramadol and Robaxin prior to arrival without relief.  Last dose of oxycodone was 24 hours ago.  Her husband is an orthopedic physician.  He states that he contacted Dr. Wynelle Link who recommended they come to the emergency department to rule out DVT.  ROS: See HPI Constitutional: no fever  Eyes: no drainage  ENT: no runny nose   Cardiovascular:  no chest pain  Resp: no SOB  GI: no vomiting GU: no dysuria Integumentary: no rash  Allergy: no hives  Musculoskeletal: no leg swelling  Neurological: no slurred speech ROS otherwise negative  PAST MEDICAL HISTORY/PAST SURGICAL HISTORY:  Past Medical History:  Diagnosis Date  . Acute medial meniscus tear, right, subsequent encounter   . Anxiety   . Complication of anesthesia    reaction to epidural medication  . Depression   . Hypertension     MEDICATIONS:  Prior to Admission medications   Medication Sig Start Date End Date Taking? Authorizing Provider  aspirin EC 325 MG EC tablet Take 1 tablet (325 mg total) by mouth 2 (two) times daily for 20 days. Then take one 81 mg aspirin once a day for three weeks. Then discontinue aspirin. 12/02/18 12/22/18  Edmisten, Ok Anis, PA  ciprofloxacin (CIPRO) 250 MG tablet Take 250 mg by mouth 2 (two) times daily. 11/15/18   [provider]  gabapentin (NEURONTIN) 300 MG capsule Take 1 capsule (300 mg total) by mouth 3 (three)  times daily. Take a 300 mg capsule three times a day for two weeks following surgery.Then take a 300 mg capsule two times a day for two weeks. Then take a 300 mg capsule once a day for two weeks. Then discontinue. 12/02/18   Edmisten, Ok Anis, PA  methocarbamol (ROBAXIN) 500 MG tablet Take 1 tablet (500 mg total) by mouth every 6 (six) hours as needed for muscle spasms. 12/02/18   Edmisten, Kristie L, PA  nebivolol (BYSTOLIC) 2.5 MG tablet Take 2.5 mg by mouth daily.    [provider]  oxyCODONE (OXY IR/ROXICODONE) 5 MG immediate release tablet Take 1-2 tablets (5-10 mg total) by mouth every 6 (six) hours as needed for severe pain. 12/02/18   Edmisten, Ok Anis, PA  traMADol (ULTRAM) 50 MG tablet Take 1-2 tablets (50-100 mg total) by mouth every 6 (six) hours as needed for moderate pain. 12/02/18   Edmisten, Ok Anis, PA    ALLERGIES:  Allergies  Allergen Reactions  . Shellfish Allergy Itching  . Erythromycin Other (See Comments)    Unknown  . Other Itching and Other (See Comments)    Pt states that she had a reaction to her epidural during labor in 1996. Severe itching.    SOCIAL HISTORY:  Social History   Tobacco Use  . Smoking status: Never Smoker  . Smokeless tobacco: Never Used  Substance Use Topics  . Alcohol use: No    FAMILY HISTORY: Family History  Problem  Relation Age of Onset  . Hypertension Mother   . Heart Problems Sister     EXAM: BP (!) 155/98 (BP Location: Left Arm)   Pulse 86   Temp 99.4 F (37.4 C) (Oral)   Resp 16   Ht 5' 4"  (1.626 m)   Wt 108.5 kg   LMP 10/15/2011   SpO2 98%   BMI 41.04 kg/m  CONSTITUTIONAL: Alert and oriented and responds appropriately to questions. Well-appearing; well-nourished HEAD: Normocephalic EYES: Conjunctivae clear, pupils appear equal, EOMI ENT: normal nose; moist mucous membranes NECK: Supple, no meningismus, no nuchal rigidity, no LAD  CARD: RRR; S1 and S2 appreciated; no murmurs, no clicks, no rubs, no  gallops RESP: Normal chest excursion without splinting or tachypnea; breath sounds clear and equal bilaterally; no wheezes, no rhonchi, no rales, no hypoxia or respiratory distress, speaking full sentences ABD/GI: Normal bowel sounds; non-distended; soft, non-tender, no rebound, no guarding, no peritoneal signs, no hepatosplenomegaly BACK:  The back appears normal and is non-tender to palpation, there is no CVA tenderness EXT: Left knee is appropriately swollen, slightly red over the incision site and warm.  Her incision is clean, dry and intact without drainage.  She is tender to palpation throughout the left popliteal fossa and the left posterior calf without obvious swelling compared to the right side.  She has 2+ DP pulses bilaterally.  Compartments in the left leg are soft. SKIN: Normal color for age and race; warm; no rash NEURO: Moves all extremities equally PSYCH: The patient's mood and manner are appropriate. Grooming and personal hygiene are appropriate.  MEDICAL DECISION MAKING: Patient here with concerns for possible DVT.  Recently underwent total knee arthroplasty.  She does have swelling, redness and warmth over the anterior left knee which is likely secondary to healing process from recent surgery but patient does report an episode of shaking chills at home.  Does not appear acutely infected but will obtain labs in the ED.  Have offered IV pain medication but patient declined stating she would prefer to take oral oxycodone given this is what she has at home.  ED PROGRESS: Patient reports pain well controlled with oxycodone.  Labs currently pending.  Patient remains hemodynamically stable.   Labs show no leukocytosis.  CRP and ESR slightly elevated which I would expect in the setting of recent surgery.  I do not feel Dr. Maureen Ralphs needs to be emergently contacted and patient and family agree.  I do not feel she has cellulitis or septic arthritis currently.  They have follow-up with Dr. Reynaldo Minium  scheduled on the 18th.  I have given her dose of therapeutic Lovenox in the emergency department and will order a venous Doppler to be performed at [facility] in the morning to rule out DVT.  Patient and husband are comfortable with this plan.  Discussed return precautions.  At this time, I do not feel there is any life-threatening condition present. I have reviewed and discussed all results (EKG, imaging, lab, urine as appropriate) and exam findings with patient/family. I have reviewed nursing notes and appropriate previous records.  I feel the patient is safe to be discharged home without further emergent workup and can continue workup as an outpatient as needed. Discussed usual and customary return precautions. Patient/family verbalize understanding and are comfortable with this plan.  Outpatient follow-up has been provided as needed. All questions have been answered.    Ward, Delice Bison, DO 12/07/18 [phone]

## 2018-12-06 NOTE — ED Notes (Signed)
Bed: TG25 Expected date:  Expected time:  Means of arrival:  Comments: Triage 2 - Aline Brochure

## 2018-12-07 ENCOUNTER — Ambulatory Visit (HOSPITAL_COMMUNITY): Admission: RE | Admit: 2018-12-07 | Payer: 59 | Source: Ambulatory Visit

## 2018-12-07 DIAGNOSIS — Z79899 Other long term (current) drug therapy: Secondary | ICD-10-CM | POA: Diagnosis not present

## 2018-12-07 DIAGNOSIS — M25562 Pain in left knee: Secondary | ICD-10-CM | POA: Diagnosis not present

## 2018-12-07 DIAGNOSIS — I10 Essential (primary) hypertension: Secondary | ICD-10-CM | POA: Diagnosis not present

## 2018-12-07 LAB — CBC WITH DIFFERENTIAL/PLATELET
Abs Immature Granulocytes: 0.03 10*3/uL (ref 0.00–0.07)
BASOS ABS: 0 10*3/uL (ref 0.0–0.1)
Basophils Relative: 0 %
Eosinophils Absolute: 0.2 10*3/uL (ref 0.0–0.5)
Eosinophils Relative: 4 %
HCT: 31.2 % — ABNORMAL LOW (ref 36.0–46.0)
HEMOGLOBIN: 9.9 g/dL — AB (ref 12.0–15.0)
Immature Granulocytes: 1 %
LYMPHS PCT: 26 %
Lymphs Abs: 1.5 10*3/uL (ref 0.7–4.0)
MCH: 28 pg (ref 26.0–34.0)
MCHC: 31.7 g/dL (ref 30.0–36.0)
MCV: 88.4 fL (ref 80.0–100.0)
Monocytes Absolute: 0.4 10*3/uL (ref 0.1–1.0)
Monocytes Relative: 8 %
Neutro Abs: 3.6 10*3/uL (ref 1.7–7.7)
Neutrophils Relative %: 61 %
Platelets: 201 10*3/uL (ref 150–400)
RBC: 3.53 MIL/uL — AB (ref 3.87–5.11)
RDW: 12.6 % (ref 11.5–15.5)
WBC: 5.8 10*3/uL (ref 4.0–10.5)
nRBC: 0 % (ref 0.0–0.2)

## 2018-12-07 LAB — BASIC METABOLIC PANEL
Anion gap: 8 (ref 5–15)
BUN: 16 mg/dL (ref 6–20)
CO2: 25 mmol/L (ref 22–32)
Calcium: 8.7 mg/dL — ABNORMAL LOW (ref 8.9–10.3)
Chloride: 102 mmol/L (ref 98–111)
Creatinine, Ser: 0.72 mg/dL (ref 0.44–1.00)
GFR calc Af Amer: >60 mL/min (ref >60)
GFR calc non Af Amer: >60 mL/min (ref >60)
Glucose, Bld: 139 mg/dL — ABNORMAL HIGH (ref 70–99)
Potassium: 3.8 mmol/L (ref 3.5–5.1)
Sodium: 135 mmol/L (ref 135–145)

## 2018-12-07 LAB — SEDIMENTATION RATE: Sed Rate: 43 mm/hr — ABNORMAL HIGH (ref 0–22)

## 2018-12-07 LAB — C-REACTIVE PROTEIN: CRP: 2.8 mg/dL — ABNORMAL HIGH (ref <1.0)

## 2018-12-07 MED ORDER — OXYCODONE-ACETAMINOPHEN 5-325 MG PO TABS
2.0000 | ORAL_TABLET | Freq: Once | ORAL | Status: AC
  Administered 2018-12-07: 2 via ORAL
  Filled 2018-12-07: qty 2

## 2018-12-07 NOTE — ED Notes (Signed)
Gauze dressing applied to surgical incision on right knee. Knee is warm and swollen, but there is no evidence of drainage from wound. Patient and family deny any needs at this time. Will continue to monitor patient.

## 2018-12-07 NOTE — Discharge Instructions (Signed)
Please take your oxycodone as prescribed.  Please monitor your temperature at home.  If you develop a fever which is a temperature of 100.4 or higher, I recommend close follow-up with your orthopedic physician or return to the emergency department.  We have scheduled you to have an ultrasound of your leg to rule out DVT in the morning.

## 2018-12-07 NOTE — ED Notes (Signed)
Attempted blood draw but was unsuccessful; asked another RN to attempt.

## 2018-12-08 NOTE — Discharge Summary (Signed)
Physician Discharge Summary   Patient ID: Deborah Kennedy MRN: [id number] DOB/AGE: 1961/06/25 58 y.o.  Admit date: 12/01/2018 Discharge date: 12/03/2018  Primary Diagnosis: Osteoarthritis, left knee   Admission Diagnoses:  Past Medical History:  Diagnosis Date  . Acute medial meniscus tear, right, subsequent encounter   . Anxiety   . Complication of anesthesia    reaction to epidural medication  . Depression   . Hypertension    Discharge Diagnoses:   Principal Problem:   OA (osteoarthritis) of knee  Estimated body mass index is 41.43 kg/m as calculated from the following:   Height as of this encounter: 5\' 4"  (1.626 m).   Weight as of this encounter: 109.5 kg.  Procedure:  Procedure(s) (LRB): TOTAL KNEE ARTHROPLASTY (Left)   Consults: None  HPI: Deborah Kennedy is a 58 y.o. year old female with end stage OA of her left knee with progressively worsening pain and dysfunction. She has constant pain, with activity and at rest and significant functional deficits with difficulties even with ADLs. She has had extensive non-op management including analgesics, injections of cortisone and viscosupplements, and home exercise program, but remains in significant pain with significant dysfunction. Radiographs show bone on bone arthritis medial and patellofemoral. She presents now for left Total Knee Arthroplasty.    Laboratory Data: Admission on 12/01/2018, Discharged on 12/03/2018  Component Date Value Ref Range Status  . Sodium 12/02/2018 138  135 - 145 mmol/L Final  . Potassium 12/02/2018 4.8  3.5 - 5.1 mmol/L Final  . Chloride 12/02/2018 109  98 - 111 mmol/L Final  . CO2 12/02/2018 21* 22 - 32 mmol/L Final  . Glucose, Bld 12/02/2018 144* 70 - 99 mg/dL Final  . BUN 12/02/2018 14  6 - 20 mg/dL Final  . Creatinine, Ser 12/02/2018 0.78  0.44 - 1.00 mg/dL Final  . Calcium 12/02/2018 8.7* 8.9 - 10.3 mg/dL Final  . GFR calc non Af Amer 12/02/2018 >60  >60 mL/min Final  .  GFR calc Af Amer 12/02/2018 >60  >60 mL/min Final  . Anion gap 12/02/2018 8  5 - 15 Final   Performed at [facility], [place] [address], [place], [place] [address]  . WBC 12/02/2018 7.3  4.0 - 10.5 K/uL Final  . RBC 12/02/2018 3.88  3.87 - 5.11 MIL/uL Final  . Hemoglobin 12/02/2018 10.8* 12.0 - 15.0 g/dL Final  . HCT 12/02/2018 34.8* 36.0 - 46.0 % Final  . MCV 12/02/2018 89.7  80.0 - 100.0 fL Final  . MCH 12/02/2018 27.8  26.0 - 34.0 pg Final  . MCHC 12/02/2018 31.0  30.0 - 36.0 g/dL Final  . RDW 12/02/2018 12.8  11.5 - 15.5 % Final  . Platelets 12/02/2018 179  150 - 400 K/uL Final  . nRBC 12/02/2018 0.0  0.0 - 0.2 % Final   Performed at [facility], [place] [address] [place],  [address]  . WBC 12/03/2018 7.5  4.0 - 10.5 K/uL Final  . RBC 12/03/2018 4.04  3.87 - 5.11 MIL/uL Final  . Hemoglobin 12/03/2018 11.0* 12.0 - 15.0 g/dL Final  . HCT 12/03/2018 35.8* 36.0 - 46.0 % Final  . MCV 12/03/2018 88.6  80.0 - 100.0 fL Final  . MCH 12/03/2018 27.2  26.0 - 34.0 pg Final  . MCHC 12/03/2018 30.7  30.0 - 36.0 g/dL Final  . RDW 12/03/2018 12.8  11.5 - 15.5 % Final  . Platelets 12/03/2018 171  150 - 400 K/uL Final  . nRBC  12/03/2018 0.0  0.0 - 0.2 % Final   Performed at [place] [address] [place], [place] [address]  . Sodium 12/03/2018 140  135 - 145 mmol/L Final  . Potassium 12/03/2018 3.7  3.5 - 5.1 mmol/L Final   Comment: DELTA CHECK NOTED REPEATED TO VERIFY   . Chloride 12/03/2018 108  98 - 111 mmol/L Final  . CO2 12/03/2018 24  22 - 32 mmol/L Final  . Glucose, Bld 12/03/2018 125* 70 - 99 mg/dL Final  . BUN 12/03/2018 14  6 - 20 mg/dL Final  . Creatinine, Ser 12/03/2018 0.67  0.44 - 1.00 mg/dL Final  . Calcium 12/03/2018 9.2  8.9 - 10.3 mg/dL Final  . GFR calc non Af Amer 12/03/2018 >60  >60 mL/min Final  . GFR calc Af Amer 12/03/2018 >60  >60 mL/min Final  . Anion gap 12/03/2018 8  5 - 15 Final   Performed at  [facility], [place] [address], [place], [place] [address]  Hospital Outpatient Visit on 11/27/2018  Component Date Value Ref Range Status  . aPTT 11/27/2018 29  24 - 36 seconds Final   Performed at [facility], [place] [address] [place], [place] [address]  . WBC 11/27/2018 4.3  4.0 - 10.5 K/uL Final  . RBC 11/27/2018 4.64  3.87 - 5.11 MIL/uL Final  . Hemoglobin 11/27/2018 12.6  12.0 - 15.0 g/dL Final  . HCT 11/27/2018 40.3  36.0 - 46.0 % Final  . MCV 11/27/2018 86.9  80.0 - 100.0 fL Final  . MCH 11/27/2018 27.2  26.0 - 34.0 pg Final  . MCHC 11/27/2018 31.3  30.0 - 36.0 g/dL Final  . RDW 11/27/2018 12.8  11.5 - 15.5 % Final  . Platelets 11/27/2018 199  150 - 400 K/uL Final  . nRBC 11/27/2018 0.0  0.0 - 0.2 % Final  . Neutrophils Relative % 11/27/2018 51  % Final  . Neutro Abs 11/27/2018 2.3  1.7 - 7.7 K/uL Final  . Lymphocytes Relative 11/27/2018 37  % Final  . Lymphs Abs 11/27/2018 1.6  0.7 - 4.0 K/uL Final  . Monocytes Relative 11/27/2018 7  % Final  . Monocytes Absolute 11/27/2018 0.3  0.1 - 1.0 K/uL Final  . Eosinophils Relative 11/27/2018 3  % Final  . Eosinophils Absolute 11/27/2018 0.1  0.0 - 0.5 K/uL Final  . Basophils Relative 11/27/2018 1  % Final  . Basophils Absolute 11/27/2018 0.0  0.0 - 0.1 K/uL Final  . Immature Granulocytes 11/27/2018 1  % Final  . Abs Immature Granulocytes 11/27/2018 0.02  0.00 - 0.07 K/uL Final   Performed at [facility], [place] [address] [place], [place] [address]  . Sodium 11/27/2018 138  135 - 145 mmol/L Final  . Potassium 11/27/2018 4.8  3.5 - 5.1 mmol/L Final  . Chloride 11/27/2018 107  98 - 111 mmol/L Final  . CO2 11/27/2018 26  22 - 32 mmol/L Final  . Glucose, Bld 11/27/2018 93  70 - 99 mg/dL Final  . BUN 11/27/2018 15  6 - 20 mg/dL Final  . Creatinine, Ser 11/27/2018 0.76  0.44 - 1.00 mg/dL Final  . Calcium 11/27/2018 9.1  8.9 - 10.3 mg/dL Final  . Total Protein 11/27/2018 7.2  6.5 -  8.1 g/dL Final  . Albumin 11/27/2018 4.4  3.5 - 5.0 g/dL Final  . AST 11/27/2018 29  15 - 41 U/L Final  . ALT 11/27/2018 17  0 - 44  U/L Final  . Alkaline Phosphatase 11/27/2018 89  38 - 126 U/L Final  . Total Bilirubin 11/27/2018 1.2  0.3 - 1.2 mg/dL Final  . GFR calc non Af Amer 11/27/2018 >60  >60 mL/min Final  . GFR calc Af Amer 11/27/2018 >60  >60 mL/min Final  . Anion gap 11/27/2018 5  5 - 15 Final   Performed at [facility], [place] [address], [place], [place] [address]  . Prothrombin Time 11/27/2018 13.5  11.4 - 15.2 seconds Final  . INR 11/27/2018 1.04   Final   Performed at [place] [address] [place], [place] [address]  . ABO/RH(D) 11/27/2018 AB POS   Final  . Antibody Screen 11/27/2018 NEG   Final  . Sample Expiration 11/27/2018 12/04/2018   Final  . Extend sample reason 11/27/2018    Final                   Value:NO TRANSFUSIONS OR PREGNANCY IN THE PAST 3 MONTHS Performed at [facility], [place] [address], [place], [place] [address]   . Color, Urine 11/27/2018 YELLOW  YELLOW Final  . APPearance 11/27/2018 CLEAR  CLEAR Final  . Specific Gravity, Urine 11/27/2018 1.026  1.005 - 1.030 Final  . pH 11/27/2018 5.0  5.0 - 8.0 Final  . Glucose, UA 11/27/2018 NEGATIVE  NEGATIVE mg/dL Final  . Hgb urine dipstick 11/27/2018 NEGATIVE  NEGATIVE Final  . Bilirubin Urine 11/27/2018 NEGATIVE  NEGATIVE Final  . Ketones, ur 11/27/2018 NEGATIVE  NEGATIVE mg/dL Final  . Protein, ur 11/27/2018 NEGATIVE  NEGATIVE mg/dL Final  . Nitrite 11/27/2018 NEGATIVE  NEGATIVE Final  . Leukocytes, UA 11/27/2018 NEGATIVE  NEGATIVE Final   Performed at [facility], [place] [address], [place], [place] [address]  . MRSA, PCR 11/27/2018 NEGATIVE  NEGATIVE Final  . Staphylococcus aureus 11/27/2018 NEGATIVE  NEGATIVE Final   Comment: (NOTE) The Xpert SA Assay (FDA approved for NASAL specimens in patients 87 years of age and  older), is one component of a comprehensive surveillance program. It is not intended to diagnose infection nor to guide or monitor treatment. Performed at [facility], [place] [address], [place], [place] [address]   . ABO/RH(D) 11/27/2018    Final                   Value:AB POS Performed at [facility], [place] [address], [place], [place] [address]      X-Rays:No results found.  EKG: Orders placed or performed during the hospital encounter of 11/27/18  . EKG 12-Lead  . EKG 12-Lead     Hospital Course: Deborah Kennedy is a 58 y.o. who was admitted to [facility] They were brought to the operating room on 12/01/2018 and underwent Procedure(s): TOTAL KNEE ARTHROPLASTY.  Patient tolerated the procedure well and was later transferred to the recovery room and then to the orthopaedic floor for postoperative care. They were given PO and IV analgesics for pain control following their surgery. They were given 24 hours of postoperative antibiotics of  Anti-infectives (From admission, onward)   Start     Dose/Rate Route Frequency Ordered Stop   12/01/18 1630  ceFAZolin (ANCEF) IVPB 2g/100 mL premix     2 g 200 mL/hr over 30 Minutes Intravenous Every 6 hours 12/01/18 1406 12/01/18 2230   12/01/18 0815  ceFAZolin (ANCEF) IVPB 2g/100 mL premix     2 g 200 mL/hr  over 30 Minutes Intravenous On call to O.R. 12/01/18 0805 12/01/18 1101     and started on DVT prophylaxis in the form of Aspirin.   PT and OT were ordered for total joint protocol. Discharge planning consulted to help with postop disposition and equipment needs. Patient had a decent night on the evening of surgery. They started to get up OOB with therapy on POD #0. Hemovac drain was pulled without difficulty on day one. Continued to work with therapy into POD #2. Pt was seen during rounds on day two and was ready to go home pending progress with therapy. Dressing was changed and the incision  was clean, dry, and intact with no drainage. Pt worked with therapy for two additional sessions and was meeting their goals. She was discharged to home later that day in stable condition.  Diet: Regular diet Activity: WBAT Follow-up: in 2 weeks Disposition: Home with HHPT followed by outpatient PT at [facility] Discharged Condition: stable   Discharge Instructions    Call MD / Call 911   Complete by:  As directed    If you experience chest pain or shortness of breath, CALL 911 and be transported to the hospital emergency room.  If you develope a fever above 101 F, pus (white drainage) or increased drainage or redness at the wound, or calf pain, call your surgeon's office.   Change dressing   Complete by:  As directed    Change dressing on Wednesday, then change the dressing daily with sterile 4 x 4 inch gauze dressing and apply TED hose.   Constipation Prevention   Complete by:  As directed    Drink plenty of fluids.  Prune juice may be helpful.  You may use a stool softener, such as Colace (over the counter) 100 mg twice a day.  Use MiraLax (over the counter) for constipation as needed.   Diet - low sodium heart healthy   Complete by:  As directed    Discharge instructions   Complete by:  As directed    Dr. Gaynelle Arabian Total Joint Specialist [facility] [address], [place], [place] [address] [phone]  TOTAL KNEE REPLACEMENT POSTOPERATIVE DIRECTIONS  Knee Rehabilitation, Guidelines Following Surgery  Results after knee surgery are often greatly improved when you follow the exercise, range of motion and muscle strengthening exercises prescribed by your doctor. Safety measures are also important to protect the knee from further injury. Any time any of these exercises cause you to have increased pain or swelling in your knee joint, decrease the amount until you are comfortable again and slowly increase them. If you have problems or questions, call your caregiver or  physical therapist for advice.   HOME CARE INSTRUCTIONS  Remove items at home which could result in a fall. This includes throw rugs or furniture in walking pathways.  ICE to the affected knee every three hours for 30 minutes at a time and then as needed for pain and swelling.  Continue to use ice on the knee for pain and swelling from surgery. You may notice swelling that will progress down to the foot and ankle.  This is normal after surgery.  Elevate the leg when you are not up walking on it.   Continue to use the breathing machine which will help keep your temperature down.  It is common for your temperature to cycle up and down following surgery, especially at night when you are not up moving around and exerting yourself.  The  breathing machine keeps your lungs expanded and your temperature down. Do not place pillow under knee, focus on keeping the knee straight while resting   DIET You may resume your previous home diet once your are discharged from the hospital.  DRESSING / WOUND CARE / SHOWERING You may shower 3 days after surgery, but keep the wounds dry during showering.  You may use an occlusive plastic wrap (Press'n Seal for example), NO SOAKING/SUBMERGING IN THE BATHTUB.  If the bandage gets wet, change with a clean dry gauze.  If the incision gets wet, pat the wound dry with a clean towel. You may start showering once you are discharged home but do not submerge the incision under water. Just pat the incision dry and apply a dry gauze dressing on daily. Change the surgical dressing daily and reapply a dry dressing each time.  ACTIVITY Walk with your walker as instructed. Use walker as long as suggested by your caregivers. Avoid periods of inactivity such as sitting longer than an hour when not asleep. This helps prevent blood clots.  You may resume a sexual relationship in one month or when given the OK by your doctor.  You may return to work once you are cleared by your doctor.    Do not drive a car for 6 weeks or until released by you surgeon.  Do not drive while taking narcotics.  WEIGHT BEARING Weight bearing as tolerated with assist device (walker, cane, etc) as directed, use it as long as suggested by your surgeon or therapist, typically at least 4-6 weeks.  POSTOPERATIVE CONSTIPATION PROTOCOL Constipation - defined medically as fewer than three stools per week and severe constipation as less than one stool per week.  One of the most common issues patients have following surgery is constipation.  Even if you have a regular bowel pattern at home, your normal regimen is likely to be disrupted due to multiple reasons following surgery.  Combination of anesthesia, postoperative narcotics, change in appetite and fluid intake all can affect your bowels.  In order to avoid complications following surgery, here are some recommendations in order to help you during your recovery period.  Colace (docusate) - Pick up an over-the-counter form of Colace or another stool softener and take twice a day as long as you are requiring postoperative pain medications.  Take with a full glass of water daily.  If you experience loose stools or diarrhea, hold the colace until you stool forms back up.  If your symptoms do not get better within 1 week or if they get worse, check with your doctor.  Dulcolax (bisacodyl) - Pick up over-the-counter and take as directed by the product packaging as needed to assist with the movement of your bowels.  Take with a full glass of water.  Use this product as needed if not relieved by Colace only.   MiraLax (polyethylene glycol) - Pick up over-the-counter to have on hand.  MiraLax is a solution that will increase the amount of water in your bowels to assist with bowel movements.  Take as directed and can mix with a glass of water, juice, soda, coffee, or tea.  Take if you go more than two days without a movement. Do not use MiraLax more than once per day. Call  your doctor if you are still constipated or irregular after using this medication for 7 days in a row.  If you continue to have problems with postoperative constipation, please contact the office for further assistance and  recommendations.  If you experience "the worst abdominal pain ever" or develop nausea or vomiting, please contact the office immediatly for further recommendations for treatment.  ITCHING  If you experience itching with your medications, try taking only a single pain pill, or even half a pain pill at a time.  You can also use Benadryl over the counter for itching or also to help with sleep.   TED HOSE STOCKINGS Wear the elastic stockings on both legs for three weeks following surgery during the day but you may remove then at night for sleeping.  MEDICATIONS See your medication summary on the "After Visit Summary" that the nursing staff will review with you prior to discharge.  You may have some home medications which will be placed on hold until you complete the course of blood thinner medication.  It is important for you to complete the blood thinner medication as prescribed by your surgeon.  Continue your approved medications as instructed at time of discharge.  PRECAUTIONS If you experience chest pain or shortness of breath - call 911 immediately for transfer to the hospital emergency department.  If you develop a fever greater that 101 F, purulent drainage from wound, increased redness or drainage from wound, foul odor from the wound/dressing, or calf pain - CONTACT YOUR SURGEON.                                                   FOLLOW-UP APPOINTMENTS Make sure you keep all of your appointments after your operation with your surgeon and caregivers. You should call the office at the above phone number and make an appointment for approximately two weeks after the date of your surgery or on the date instructed by your surgeon outlined in the "After Visit Summary".   RANGE OF  MOTION AND STRENGTHENING EXERCISES  Rehabilitation of the knee is important following a knee injury or an operation. After just a few days of immobilization, the muscles of the thigh which control the knee become weakened and shrink (atrophy). Knee exercises are designed to build up the tone and strength of the thigh muscles and to improve knee motion. Often times heat used for twenty to thirty minutes before working out will loosen up your tissues and help with improving the range of motion but do not use heat for the first two weeks following surgery. These exercises can be done on a training (exercise) mat, on the floor, on a table or on a bed. Use what ever works the best and is most comfortable for you Knee exercises include:  Leg Lifts - While your knee is still immobilized in a splint or cast, you can do straight leg raises. Lift the leg to 60 degrees, hold for 3 sec, and slowly lower the leg. Repeat 10-20 times 2-3 times daily. Perform this exercise against resistance later as your knee gets better.  Quad and Hamstring Sets - Tighten up the muscle on the front of the thigh (Quad) and hold for 5-10 sec. Repeat this 10-20 times hourly. Hamstring sets are done by pushing the foot backward against an object and holding for 5-10 sec. Repeat as with quad sets.  Leg Slides: Lying on your back, slowly slide your foot toward your buttocks, bending your knee up off the floor (only go as far as is comfortable). Then slowly slide your foot  back down until your leg is flat on the floor again. Angel Wings: Lying on your back spread your legs to the side as far apart as you can without causing discomfort.  A rehabilitation program following serious knee injuries can speed recovery and prevent re-injury in the future due to weakened muscles. Contact your doctor or a physical therapist for more information on knee rehabilitation.   IF YOU ARE TRANSFERRED TO A SKILLED REHAB FACILITY If the patient is transferred to a  skilled rehab facility following release from the hospital, a list of the current medications will be sent to the facility for the patient to continue.  When discharged from the skilled rehab facility, please have the facility set up the patient's [place] prior to being released. Also, the skilled facility will be responsible for providing the patient with their medications at time of release from the facility to include their pain medication, the muscle relaxants, and their blood thinner medication. If the patient is still at the rehab facility at time of the two week follow up appointment, the skilled rehab facility will also need to assist the patient in arranging follow up appointment in our office and any transportation needs.  MAKE SURE YOU:  Understand these instructions.  Get help right away if you are not doing well or get worse.    Pick up stool softner and laxative for home use following surgery while on pain medications. Do not submerge incision under water. Please use good hand washing techniques while changing dressing each day. May shower starting three days after surgery. Please use a clean towel to pat the incision dry following showers. Continue to use ice for pain and swelling after surgery. Do not use any lotions or creams on the incision until instructed by your surgeon.   Do not put a pillow under the knee. Place it under the heel.   Complete by:  As directed    Driving restrictions   Complete by:  As directed    No driving for two weeks   TED hose   Complete by:  As directed    Use stockings (TED hose) for three weeks on both leg(s).  You may remove them at night for sleeping.   Weight bearing as tolerated   Complete by:  As directed      Allergies as of 12/03/2018      Reactions   Shellfish Allergy Itching   Erythromycin Other (See Comments)   Unknown   Other Itching, Other (See Comments)   Pt states that she had a reaction to her epidural  during labor in 1996. Severe itching.      Medication List    STOP taking these medications   naproxen sodium 220 MG tablet Commonly known as:  ALEVE     TAKE these medications   aspirin 325 MG EC tablet Take 1 tablet (325 mg total) by mouth 2 (two) times daily for 20 days. Then take one 81 mg aspirin once a day for three weeks. Then discontinue aspirin.   ciprofloxacin 250 MG tablet Commonly known as:  CIPRO Take 250 mg by mouth 2 (two) times daily.   gabapentin 300 MG capsule Commonly known as:  NEURONTIN Take 1 capsule (300 mg total) by mouth 3 (three) times daily. Take a 300 mg capsule three times a day for two weeks following surgery.Then take a 300 mg capsule two times a day for two weeks. Then take a 300 mg capsule once a  day for two weeks. Then discontinue.   methocarbamol 500 MG tablet Commonly known as:  ROBAXIN Take 1 tablet (500 mg total) by mouth every 6 (six) hours as needed for muscle spasms.   nebivolol 2.5 MG tablet Commonly known as:  BYSTOLIC Take 2.5 mg by mouth daily.   oxyCODONE 5 MG immediate release tablet Commonly known as:  Oxy IR/ROXICODONE Take 1-2 tablets (5-10 mg total) by mouth every 6 (six) hours as needed for severe pain.   traMADol 50 MG tablet Commonly known as:  ULTRAM Take 1-2 tablets (50-100 mg total) by mouth every 6 (six) hours as needed for moderate pain.            Discharge Care Instructions  (From admission, onward)         Start     Ordered   12/02/18 0000  Weight bearing as tolerated     12/02/18 0735   12/02/18 0000  Change dressing    Comments:  Change dressing on Wednesday, then change the dressing daily with sterile 4 x 4 inch gauze dressing and apply TED hose.   12/02/18 0735         Follow-up Information    Gaynelle Arabian, MD. Schedule an appointment as soon as possible for a visit on 12/16/2018.   Specialty:  Orthopedic Surgery Contact information: [address] [place]  [address] [phone]           Signed: Theresa Duty, PA-C Orthopedic Surgery 12/08/2018, 9:17 AM

## 2018-12-09 DIAGNOSIS — Z981 Arthrodesis status: Secondary | ICD-10-CM | POA: Diagnosis not present

## 2018-12-09 DIAGNOSIS — Z6830 Body mass index (BMI) 30.0-30.9, adult: Secondary | ICD-10-CM | POA: Diagnosis not present

## 2018-12-09 DIAGNOSIS — Z471 Aftercare following joint replacement surgery: Secondary | ICD-10-CM | POA: Diagnosis not present

## 2018-12-09 DIAGNOSIS — Z96652 Presence of left artificial knee joint: Secondary | ICD-10-CM | POA: Diagnosis not present

## 2018-12-09 DIAGNOSIS — I10 Essential (primary) hypertension: Secondary | ICD-10-CM | POA: Diagnosis not present

## 2018-12-09 DIAGNOSIS — E669 Obesity, unspecified: Secondary | ICD-10-CM | POA: Diagnosis not present

## 2018-12-10 DIAGNOSIS — E669 Obesity, unspecified: Secondary | ICD-10-CM | POA: Diagnosis not present

## 2018-12-10 DIAGNOSIS — Z96652 Presence of left artificial knee joint: Secondary | ICD-10-CM | POA: Diagnosis not present

## 2018-12-10 DIAGNOSIS — Z6830 Body mass index (BMI) 30.0-30.9, adult: Secondary | ICD-10-CM | POA: Diagnosis not present

## 2018-12-10 DIAGNOSIS — I10 Essential (primary) hypertension: Secondary | ICD-10-CM | POA: Diagnosis not present

## 2018-12-10 DIAGNOSIS — Z471 Aftercare following joint replacement surgery: Secondary | ICD-10-CM | POA: Diagnosis not present

## 2018-12-10 DIAGNOSIS — Z981 Arthrodesis status: Secondary | ICD-10-CM | POA: Diagnosis not present

## 2018-12-11 DIAGNOSIS — Z981 Arthrodesis status: Secondary | ICD-10-CM | POA: Diagnosis not present

## 2018-12-11 DIAGNOSIS — Z471 Aftercare following joint replacement surgery: Secondary | ICD-10-CM | POA: Diagnosis not present

## 2018-12-11 DIAGNOSIS — I10 Essential (primary) hypertension: Secondary | ICD-10-CM | POA: Diagnosis not present

## 2018-12-11 DIAGNOSIS — Z96652 Presence of left artificial knee joint: Secondary | ICD-10-CM | POA: Diagnosis not present

## 2018-12-11 DIAGNOSIS — E669 Obesity, unspecified: Secondary | ICD-10-CM | POA: Diagnosis not present

## 2018-12-11 DIAGNOSIS — Z6830 Body mass index (BMI) 30.0-30.9, adult: Secondary | ICD-10-CM | POA: Diagnosis not present

## 2018-12-23 DIAGNOSIS — M6281 Muscle weakness (generalized): Secondary | ICD-10-CM | POA: Diagnosis not present

## 2018-12-23 DIAGNOSIS — M25662 Stiffness of left knee, not elsewhere classified: Secondary | ICD-10-CM | POA: Diagnosis not present

## 2018-12-23 DIAGNOSIS — Z96652 Presence of left artificial knee joint: Secondary | ICD-10-CM | POA: Diagnosis not present

## 2018-12-23 DIAGNOSIS — R2689 Other abnormalities of gait and mobility: Secondary | ICD-10-CM | POA: Diagnosis not present

## 2018-12-23 DIAGNOSIS — M1712 Unilateral primary osteoarthritis, left knee: Secondary | ICD-10-CM | POA: Diagnosis not present

## 2018-12-23 DIAGNOSIS — Z471 Aftercare following joint replacement surgery: Secondary | ICD-10-CM | POA: Diagnosis not present

## 2018-12-25 DIAGNOSIS — R2689 Other abnormalities of gait and mobility: Secondary | ICD-10-CM | POA: Diagnosis not present

## 2018-12-25 DIAGNOSIS — Z96652 Presence of left artificial knee joint: Secondary | ICD-10-CM | POA: Diagnosis not present

## 2018-12-25 DIAGNOSIS — M25662 Stiffness of left knee, not elsewhere classified: Secondary | ICD-10-CM | POA: Diagnosis not present

## 2018-12-25 DIAGNOSIS — Z471 Aftercare following joint replacement surgery: Secondary | ICD-10-CM | POA: Diagnosis not present

## 2018-12-25 DIAGNOSIS — M1712 Unilateral primary osteoarthritis, left knee: Secondary | ICD-10-CM | POA: Diagnosis not present

## 2018-12-25 DIAGNOSIS — M6281 Muscle weakness (generalized): Secondary | ICD-10-CM | POA: Diagnosis not present

## 2018-12-31 DIAGNOSIS — M1712 Unilateral primary osteoarthritis, left knee: Secondary | ICD-10-CM | POA: Diagnosis not present

## 2018-12-31 DIAGNOSIS — R2689 Other abnormalities of gait and mobility: Secondary | ICD-10-CM | POA: Diagnosis not present

## 2018-12-31 DIAGNOSIS — Z96652 Presence of left artificial knee joint: Secondary | ICD-10-CM | POA: Diagnosis not present

## 2018-12-31 DIAGNOSIS — M6281 Muscle weakness (generalized): Secondary | ICD-10-CM | POA: Diagnosis not present

## 2018-12-31 DIAGNOSIS — Z471 Aftercare following joint replacement surgery: Secondary | ICD-10-CM | POA: Diagnosis not present

## 2018-12-31 DIAGNOSIS — M25662 Stiffness of left knee, not elsewhere classified: Secondary | ICD-10-CM | POA: Diagnosis not present

## 2019-01-02 DIAGNOSIS — Z96652 Presence of left artificial knee joint: Secondary | ICD-10-CM | POA: Diagnosis not present

## 2019-01-02 DIAGNOSIS — M1712 Unilateral primary osteoarthritis, left knee: Secondary | ICD-10-CM | POA: Diagnosis not present

## 2019-01-02 DIAGNOSIS — M25662 Stiffness of left knee, not elsewhere classified: Secondary | ICD-10-CM | POA: Diagnosis not present

## 2019-01-02 DIAGNOSIS — R2689 Other abnormalities of gait and mobility: Secondary | ICD-10-CM | POA: Diagnosis not present

## 2019-01-02 DIAGNOSIS — M6281 Muscle weakness (generalized): Secondary | ICD-10-CM | POA: Diagnosis not present

## 2019-01-02 DIAGNOSIS — Z471 Aftercare following joint replacement surgery: Secondary | ICD-10-CM | POA: Diagnosis not present

## 2019-01-05 DIAGNOSIS — M25662 Stiffness of left knee, not elsewhere classified: Secondary | ICD-10-CM | POA: Diagnosis not present

## 2019-01-05 DIAGNOSIS — Z471 Aftercare following joint replacement surgery: Secondary | ICD-10-CM | POA: Diagnosis not present

## 2019-01-05 DIAGNOSIS — Z96652 Presence of left artificial knee joint: Secondary | ICD-10-CM | POA: Diagnosis not present

## 2019-01-05 DIAGNOSIS — M1712 Unilateral primary osteoarthritis, left knee: Secondary | ICD-10-CM | POA: Diagnosis not present

## 2019-01-05 DIAGNOSIS — R2689 Other abnormalities of gait and mobility: Secondary | ICD-10-CM | POA: Diagnosis not present

## 2019-01-05 DIAGNOSIS — M6281 Muscle weakness (generalized): Secondary | ICD-10-CM | POA: Diagnosis not present

## 2019-01-06 DIAGNOSIS — Z96652 Presence of left artificial knee joint: Secondary | ICD-10-CM | POA: Diagnosis not present

## 2019-01-06 DIAGNOSIS — Z471 Aftercare following joint replacement surgery: Secondary | ICD-10-CM | POA: Diagnosis not present

## 2019-01-09 DIAGNOSIS — Z471 Aftercare following joint replacement surgery: Secondary | ICD-10-CM | POA: Diagnosis not present

## 2019-01-09 DIAGNOSIS — M1712 Unilateral primary osteoarthritis, left knee: Secondary | ICD-10-CM | POA: Diagnosis not present

## 2019-01-09 DIAGNOSIS — M25662 Stiffness of left knee, not elsewhere classified: Secondary | ICD-10-CM | POA: Diagnosis not present

## 2019-01-09 DIAGNOSIS — R2689 Other abnormalities of gait and mobility: Secondary | ICD-10-CM | POA: Diagnosis not present

## 2019-01-09 DIAGNOSIS — M6281 Muscle weakness (generalized): Secondary | ICD-10-CM | POA: Diagnosis not present

## 2019-01-09 DIAGNOSIS — Z96652 Presence of left artificial knee joint: Secondary | ICD-10-CM | POA: Diagnosis not present

## 2019-05-11 ENCOUNTER — Ambulatory Visit (INDEPENDENT_AMBULATORY_CARE_PROVIDER_SITE_OTHER): Payer: 59

## 2019-05-11 ENCOUNTER — Other Ambulatory Visit: Payer: Self-pay | Admitting: Orthopaedic Surgery

## 2019-05-11 ENCOUNTER — Other Ambulatory Visit: Payer: Self-pay

## 2019-05-11 DIAGNOSIS — M79661 Pain in right lower leg: Secondary | ICD-10-CM

## 2019-05-18 ENCOUNTER — Ambulatory Visit: Payer: 59 | Admitting: Orthopedic Surgery

## 2019-05-20 ENCOUNTER — Ambulatory Visit: Payer: 59 | Admitting: Orthopedic Surgery

## 2019-06-04 MED FILL — BYSTOLIC 5 MG TABLET: 5 | 90 days supply | Qty: 90 | Fill #1

## 2019-06-26 DIAGNOSIS — N390 Urinary tract infection, site not specified: Secondary | ICD-10-CM | POA: Diagnosis not present

## 2020-01-03 ENCOUNTER — Ambulatory Visit: Payer: 59 | Attending: Internal Medicine

## 2020-01-03 DIAGNOSIS — Z23 Encounter for immunization: Secondary | ICD-10-CM | POA: Insufficient documentation

## 2020-01-03 NOTE — Progress Notes (Signed)
   Covid-19 Vaccination Clinic  Name:  Uliana Ladue    MRN: [id number] DOB: Jul 23, 1961  01/03/2020  Ms. Hotchkiss was observed post Covid-19 immunization for 15 minutes without incident. She was provided with Vaccine Information Sheet and instruction to access the V-Safe system.   Ms. Bulley was instructed to call 911 with any severe reactions post vaccine: [place] Kitchen Difficulty breathing  . Swelling of face and throat  . A fast heartbeat  . A bad rash all over body  . Dizziness and weakness   Immunizations Administered    Name Date Dose VIS Date Route   Pfizer COVID-19 Vaccine 01/03/2020  7:05 PM 0.3 mL 10/09/2019 Intramuscular   Manufacturer: [place]   Lot: [id number]   [place]: [id number]

## 2020-01-05 DIAGNOSIS — R002 Palpitations: Secondary | ICD-10-CM | POA: Diagnosis not present

## 2020-01-05 DIAGNOSIS — I10 Essential (primary) hypertension: Secondary | ICD-10-CM | POA: Diagnosis not present

## 2020-01-05 DIAGNOSIS — R079 Chest pain, unspecified: Secondary | ICD-10-CM | POA: Diagnosis not present

## 2020-01-05 MED FILL — BYSTOLIC 5 MG TABLET: 5 | 90 days supply | Qty: 90 | Fill #0

## 2020-01-18 DIAGNOSIS — I10 Essential (primary) hypertension: Secondary | ICD-10-CM | POA: Diagnosis not present

## 2020-01-24 ENCOUNTER — Other Ambulatory Visit: Payer: Self-pay

## 2020-01-24 ENCOUNTER — Ambulatory Visit: Payer: 59 | Attending: Internal Medicine

## 2020-01-24 DIAGNOSIS — Z23 Encounter for immunization: Secondary | ICD-10-CM

## 2020-01-24 NOTE — Progress Notes (Signed)
   Covid-19 Vaccination Clinic  Name:  Deborah Kennedy    MRN: [id number] DOB: [birth year]  01/24/2020  [name] was observed post Covid-19 immunization for 15 minutes without incident. She was provided with Vaccine Information Sheet and instruction to access the V-Safe system.   [name] was instructed to call 911 with any severe reactions post vaccine: Marland Kitchen Difficulty breathing  . Swelling of face and throat  . A fast heartbeat  . A bad rash all over body  . Dizziness and weakness   Immunizations Administered    Name Date Dose VIS Date Route   Pfizer COVID-19 Vaccine 01/24/2020  4:47 PM 0.3 mL 10/09/2019 Intramuscular   Manufacturer: [place]   Lot: [id number]   [place]: [id number]

## 2020-04-13 DIAGNOSIS — M5416 Radiculopathy, lumbar region: Secondary | ICD-10-CM | POA: Diagnosis not present

## 2020-04-13 DIAGNOSIS — M25552 Pain in left hip: Secondary | ICD-10-CM | POA: Diagnosis not present

## 2020-04-13 DIAGNOSIS — M4316 Spondylolisthesis, lumbar region: Secondary | ICD-10-CM | POA: Diagnosis not present

## 2020-04-13 DIAGNOSIS — M545 Low back pain: Secondary | ICD-10-CM | POA: Diagnosis not present

## 2020-04-16 DIAGNOSIS — E669 Obesity, unspecified: Secondary | ICD-10-CM | POA: Diagnosis not present

## 2020-04-16 DIAGNOSIS — R079 Chest pain, unspecified: Secondary | ICD-10-CM | POA: Diagnosis not present

## 2020-04-16 DIAGNOSIS — I10 Essential (primary) hypertension: Secondary | ICD-10-CM | POA: Diagnosis not present

## 2020-04-16 DIAGNOSIS — R002 Palpitations: Secondary | ICD-10-CM | POA: Diagnosis not present

## 2020-05-04 ENCOUNTER — Encounter: Payer: Self-pay | Admitting: Obstetrics and Gynecology

## 2020-05-05 ENCOUNTER — Other Ambulatory Visit: Payer: 59

## 2020-05-05 DIAGNOSIS — R319 Hematuria, unspecified: Secondary | ICD-10-CM | POA: Diagnosis not present

## 2020-05-05 DIAGNOSIS — R300 Dysuria: Secondary | ICD-10-CM | POA: Diagnosis not present

## 2020-05-05 NOTE — Progress Notes (Signed)
Patient came into office stating she had spoken with Dr Glo Herring regarding a possible UTI. She is having dysuria along with hematuria.  I spoke with Dr Glo Herring who stated he had call in an order for antibiotics and would like for me to place an order for a urine culture.  Pt informed. Urine sent.

## 2020-05-06 MED FILL — BYSTOLIC 5 MG TABLET: 5 | 90 days supply | Qty: 90 | Fill #1

## 2020-05-09 LAB — URINE CULTURE

## 2020-05-09 NOTE — Progress Notes (Signed)
Mixed flora at very low levels, so likely not a fully developed UTI, patient has completed 5 days of septra and pyridium, so no followup treatment advised.  IF patient has questions , please make as a TeleVisit., and encourage pt to obtain MyChart.

## 2020-05-13 ENCOUNTER — Telehealth: Payer: Self-pay | Admitting: *Deleted

## 2020-05-13 NOTE — Telephone Encounter (Signed)
Attempted to call patient and left vm to call back for results.

## 2020-05-13 NOTE — Telephone Encounter (Signed)
-----   Message from Jonnie Kind, MD sent at 05/09/2020  9:52 PM EDT ----- Mixed flora at very low levels, so likely not a fully developed UTI, patient has completed 5 days of septra and pyridium, so no followup treatment advised.  IF patient has questions , please make as a TeleVisit., and encourage pt to obtain MyChart.

## 2020-08-09 DIAGNOSIS — I10 Essential (primary) hypertension: Secondary | ICD-10-CM | POA: Diagnosis not present

## 2020-08-09 DIAGNOSIS — E785 Hyperlipidemia, unspecified: Secondary | ICD-10-CM | POA: Diagnosis not present

## 2020-08-09 DIAGNOSIS — R079 Chest pain, unspecified: Secondary | ICD-10-CM | POA: Diagnosis not present

## 2020-08-09 DIAGNOSIS — R350 Frequency of micturition: Secondary | ICD-10-CM | POA: Diagnosis not present

## 2020-08-18 ENCOUNTER — Encounter: Payer: Self-pay | Admitting: Cardiology

## 2020-08-18 ENCOUNTER — Other Ambulatory Visit: Payer: Self-pay

## 2020-08-18 ENCOUNTER — Ambulatory Visit: Payer: Self-pay | Admitting: Cardiology

## 2020-08-18 VITALS — BP 140/82 | HR 61 | Ht 64.0 in | Wt 248.0 lb

## 2020-08-18 DIAGNOSIS — Z8249 Family history of ischemic heart disease and other diseases of the circulatory system: Secondary | ICD-10-CM | POA: Diagnosis not present

## 2020-08-18 DIAGNOSIS — R030 Elevated blood-pressure reading, without diagnosis of hypertension: Secondary | ICD-10-CM | POA: Diagnosis not present

## 2020-08-18 DIAGNOSIS — R0602 Shortness of breath: Secondary | ICD-10-CM | POA: Diagnosis not present

## 2020-08-18 DIAGNOSIS — R072 Precordial pain: Secondary | ICD-10-CM | POA: Diagnosis not present

## 2020-08-18 DIAGNOSIS — R079 Chest pain, unspecified: Secondary | ICD-10-CM | POA: Diagnosis not present

## 2020-08-18 DIAGNOSIS — Z6841 Body Mass Index (BMI) 40.0 and over, adult: Secondary | ICD-10-CM | POA: Diagnosis not present

## 2020-08-18 DIAGNOSIS — E66813 Obesity, class 3: Secondary | ICD-10-CM

## 2020-08-18 NOTE — Progress Notes (Signed)
Date:  08/18/2020   ID:  [name], DOB 1960/12/23, MRN [id number]  PCP:  Iona Beard, MD  Cardiologist:  Rex Kras, DO, [facility] (established care 08/18/2020) Former Cardiology Providers: NA  REASON FOR CONSULT: Chest Pain and shortness of breath.   REQUESTING PHYSICIAN:  Iona Beard, [address] [place],  [place] [address]  Chief Complaint  Patient presents with  . Chest Pain    chest pain /sob for 2 weeks  . New Patient (Initial Visit)  . Shortness of Breath    HPI  Deborah Kennedy is a 59 y.o. female who presents to the office with a chief complaint of " chest pain and shortness of breath." Patient's past medical history and cardiovascular risk factors include: Elevated blood pressures without diagnosis of hypertension, family history of premature CAD, obesity due to excess calories.  She is referred to the office at the request of Iona Beard, MD for evaluation of chest pain and shortness of breath.  Shortness of breath: Patient states that she is been having shortness of breath for the last 6 months.  She states that the shortness of breath is not associated with effort related activities.  She states that she walks at least 3 miles per day and during that she is not getting dyspneic.  However, this could be secondary to the fact that she is walking slower.  No change in physical endurance.  She states the majority of time she gets shortness of breath is when she goes in and out of her car.  Chest pain: Over the last 2 weeks she has been experiencing substernal chest discomfort.  She describes the discomfort as a piercing-like sensation, intermittent, duration is for seconds, not brought on with exertion and it does not resolve with rest.  The discomfort is self-limited.  No improving or worsening factors.  Family history of premature coronary disease with one of her sisters having multiple PCIs starting as early as 52 years off age. No family  history of premature sudden cardiac death.   Denies prior history of coronary artery disease, myocardial infarction, congestive heart failure, deep venous thrombosis, pulmonary embolism, stroke, transient ischemic attack.  FUNCTIONAL STATUS: Walks 3 miles per day.   ALLERGIES: Allergies  Allergen Reactions  . Shellfish Allergy Itching  . Erythromycin Other (See Comments)    Unknown  . Other Itching and Other (See Comments)    Pt states that she had a reaction to her epidural during labor in 1996. Severe itching.    MEDICATION LIST PRIOR TO VISIT: Current Meds  Medication Sig  . acetaminophen (TYLENOL) 500 MG tablet Take 500 mg by mouth every 6 (six) hours as needed.  . APPLE CIDER VINEGAR PO Take by mouth.  Marland Kitchen aspirin EC 81 MG tablet Take 81 mg by mouth daily. Swallow whole.  Marland Kitchen BYSTOLIC 5 MG tablet Take 5 mg by mouth daily.  Marland Kitchen ibuprofen (ADVIL) 200 MG tablet Take 200 mg by mouth every 6 (six) hours as needed.  . Multiple Vitamins-Minerals (MULTIVITAMIN WITH MINERALS) tablet Take 1 tablet by mouth daily.     PAST MEDICAL HISTORY: Past Medical History:  Diagnosis Date  . Acute medial meniscus tear, right, subsequent encounter   . Anxiety   . Complication of anesthesia    reaction to epidural medication  . Depression   . Hypertension     PAST SURGICAL HISTORY: Past Surgical History:  Procedure Laterality Date  . ABDOMINAL HYSTERECTOMY  10/18/2011  Procedure: HYSTERECTOMY ABDOMINAL;  Surgeon: Jonnie Kind, MD;  Location: [facility];  Service: Gynecology;  Laterality: N/A;  . CERVICAL FUSION  2008  . KNEE ARTHROSCOPY WITH MEDIAL MENISECTOMY Right 06/14/2017   Procedure: RIGHT KNEE ARTHROSCOPY WITH MEDIAL MENISECTOMY;  Surgeon: Elsie Saas, MD;  Location: [facility];  Service: Orthopedics;  Laterality: Right;  . TOTAL KNEE ARTHROPLASTY Left 12/01/2018   Procedure: TOTAL KNEE ARTHROPLASTY;  Surgeon: Gaynelle Arabian, MD;  Location: [facility];  Service: Orthopedics;   Laterality: Left;  41mn  . TUBAL LIGATION      FAMILY HISTORY: The patient family history includes CAD in her maternal grandmother and sister; Cancer in her father; Diabetes in her maternal grandmother; Heart Problems in her sister; Heart attack in her maternal grandfather; Heart failure in her maternal grandmother; Hypertension in her mother.  SOCIAL HISTORY:  The patient  reports that she has never smoked. She has never used smokeless tobacco. She reports that she does not drink alcohol and does not use drugs.  REVIEW OF SYSTEMS: Review of Systems  Constitutional: Negative for chills and fever.  HENT: Negative for hoarse voice and nosebleeds.   Eyes: Negative for discharge, double vision and pain.  Cardiovascular: Positive for chest pain and palpitations. Negative for claudication, leg swelling, near-syncope, orthopnea, paroxysmal nocturnal dyspnea and syncope.  Respiratory: Positive for shortness of breath. Negative for hemoptysis.   Musculoskeletal: Positive for back pain and joint pain (knees). Negative for muscle cramps and myalgias.  Gastrointestinal: Negative for abdominal pain, constipation, diarrhea, hematemesis, hematochezia, melena, nausea and vomiting.  Neurological: Negative for dizziness and light-headedness.   PHYSICAL EXAM: Vitals with BMI 08/18/2020 12/07/2018 12/07/2018  Height 5' 4"  - -  Weight 248 lbs - -  BMI 438.88- -  Systolic [id number] Diastolic 82 72 73  Pulse 61 82 86    CONSTITUTIONAL: Well-developed and well-nourished. No acute distress.  SKIN: Skin is warm and dry. No rash noted. No cyanosis. No pallor. No jaundice HEAD: Normocephalic and atraumatic.  EYES: No scleral icterus MOUTH/THROAT: Moist oral membranes.  NECK: No JVD present. No thyromegaly noted. No carotid bruits  LYMPHATIC: No visible cervical adenopathy.  CHEST Normal respiratory effort. No intercostal retractions  LUNGS: Clear to auscultation bilaterally.  No stridor. No wheezes. No  rales.  CARDIOVASCULAR: Regular rate and rhythm, positive S1-S2, no murmurs rubs or gallops appreciated ABDOMINAL: Obese, soft, nontender, nondistended, positive bowel sounds in all 4 quadrants, no apparent ascites.  EXTREMITIES: No peripheral edema, 2+ dorsalis pedis and posterior tibial pulses. HEMATOLOGIC: No significant bruising NEUROLOGIC: Oriented to person, place, and time. Nonfocal. Normal muscle tone.  PSYCHIATRIC: Normal mood and affect. Normal behavior. Cooperative  CARDIAC DATABASE: EKG: 08/09/2020: Sinus bradycardia, 54 bpm, normal axis, without underlying ischemia or injury pattern.    Echocardiogram: No results found for this or any previous visit from the past 1095 days.   Stress Testing: No results found for this or any previous visit from the past 1095 days.  Heart Catheterization: None  LABORATORY DATA: CBC Latest Ref Rng & Units 12/07/2018 12/03/2018 12/02/2018  WBC 4.0 - 10.5 K/uL 5.8 7.5 7.3  Hemoglobin 12.0 - 15.0 g/dL 9.9(L) 11.0(L) 10.8(L)  Hematocrit 36 - 46 % 31.2(L) 35.8(L) 34.8(L)  Platelets 150 - 400 K/uL 201 171 179    CMP Latest Ref Rng & Units 12/07/2018 12/03/2018 12/02/2018  Glucose 70 - 99 mg/dL 139(H) 125(H) 144(H)  BUN 6 - 20 mg/dL 16 14 14   Creatinine 0.44 -  1.00 mg/dL 0.72 0.67 0.78  Sodium 135 - 145 mmol/L 135 140 138  Potassium 3.5 - 5.1 mmol/L 3.8 3.7 4.8  Chloride 98 - 111 mmol/L 102 108 109  CO2 22 - 32 mmol/L 25 24 21(L)  Calcium 8.9 - 10.3 mg/dL 8.7(L) 9.2 8.7(L)  Total Protein 6.5 - 8.1 g/dL - - -  Total Bilirubin 0.3 - 1.2 mg/dL - - -  Alkaline Phos 38 - 126 U/L - - -  AST 15 - 41 U/L - - -  ALT 0 - 44 U/L - - -   Lipid Panel     Component Value Date/Time   CHOL 128 10/31/2009 1842   TRIG 51 10/31/2009 1842   HDL 41 10/31/2009 1842   CHOLHDL 3.1 Ratio 10/31/2009 1842   VLDL 10 10/31/2009 1842   LDLCALC 77 10/31/2009 1842    No components found for: NTPROBNP No results for input(s): PROBNP in the last 8760 hours. No results  for input(s): TSH in the last 8760 hours.  BMP No results for input(s): NA, K, CL, CO2, GLUCOSE, BUN, CREATININE, CALCIUM, GFRNONAA, GFRAA in the last 8760 hours.  HEMOGLOBIN A1C No results found for: HGBA1C, MPG   External Labs: Collected: 08/09/2020 Creatinine 0.84 mg/dL. eGFR: 90 mL/min per 1.73 m Lipid profile: Total cholesterol 193, triglycerides 139, HDL 45, LDL 132, non-HDL 148  IMPRESSION:    ICD-10-CM   1. Precordial pain  R07.2 EKG 12-Lead    PCV ECHOCARDIOGRAM COMPLETE    PCV MYOCARDIAL PERFUSION WO LEXISCAN  2. Shortness of breath  R06.02   3. Blood pressure elevated without history of HTN  R03.0   4. Family history of premature CAD  Z82.49   5. Class 3 severe obesity due to excess calories without serious comorbidity with body mass index (BMI) of 40.0 to 44.9 in adult [facility])  E66.01    Z68.41      RECOMMENDATIONS: Deborah Kennedy is a 59 y.o. female whose past medical history and cardiac risk factors include: Elevated blood pressures without diagnosis of hypertension, family history of premature CAD, obesity due to excess calories.  Precordial chest pain:  Patient symptoms of chest discomfort appear to be atypical in nature.  Echocardiogram will be ordered to evaluate for structural heart disease and left ventricular systolic function.  We discussed undergoing either coronary CTA or walking nuclear stress test.  Shared decision was to proceed with walking nuclear stress test to evaluate functional capacity and for reversible ischemia.  EKG notes normal sinus rhythm.  Patient is asked to hold Bystolic 2 days prior to her stress test.  Shortness of breath:  Patient is overall euvolemic and not in congestive heart failure.  No recent history of prolonged immobilization, no recent surgeries, and no prior history of DVT or PE.  Patient is not tachycardic on examination.  Echocardiogram will be ordered to evaluate for structural heart disease and left  ventricular systolic function.  Family history of premature CAD:  Recommended coronary artery calcium scoring for further stratification.  However, patient would like to consider this at the next visit.  Primary prevention:  Educated on the importance of improving modifiable cardiovascular risk factors.  Based on the office blood pressures I suspect that she may have underlying hypertension.  She is asked to keep a log of her blood pressures and to bring it in at the next office visit with me or myself or her PCP for further evaluation and management.  Reviewed her most recent lipid profile  performed at her PCPs office her LDL is not at goal.  Patient would like to implement lifestyle modifications for 6 weeks and will have it rechecked with her PCP.  Educated on importance of weight loss by participating in moderate intensity exercise for 30 minutes a day 5 days a week and dietary changes.  She is asked to be screened for diabetes when she has repeat blood work with her PCP.   FINAL MEDICATION LIST END OF ENCOUNTER: No orders of the defined types were placed in this encounter.    Current Outpatient Medications:  .  acetaminophen (TYLENOL) 500 MG tablet, Take 500 mg by mouth every 6 (six) hours as needed., Disp: , Rfl:  .  APPLE CIDER VINEGAR PO, Take by mouth., Disp: , Rfl:  .  aspirin EC 81 MG tablet, Take 81 mg by mouth daily. Swallow whole., Disp: , Rfl:  .  BYSTOLIC 5 MG tablet, Take 5 mg by mouth daily., Disp: , Rfl:  .  ibuprofen (ADVIL) 200 MG tablet, Take 200 mg by mouth every 6 (six) hours as needed., Disp: , Rfl:  .  Multiple Vitamins-Minerals (MULTIVITAMIN WITH MINERALS) tablet, Take 1 tablet by mouth daily., Disp: , Rfl:   Orders Placed This Encounter  Procedures  . PCV MYOCARDIAL PERFUSION WO LEXISCAN  . EKG 12-Lead  . PCV ECHOCARDIOGRAM COMPLETE    There are no Patient Instructions on file for this visit.   --Continue cardiac medications as reconciled in final  medication list. --Return in about 3 weeks (around 09/08/2020) for Reevaluation of, Chest pain, Dyspnea, Review test results. Or sooner if needed. --Continue follow-up with your primary care physician regarding the management of your other chronic comorbid conditions.  Patient's questions and concerns were addressed to her satisfaction. She voices understanding of the instructions provided during this encounter.   This note was created using a voice recognition software as a result there may be grammatical errors inadvertently enclosed that do not reflect the nature of this encounter. Every attempt is made to correct such errors.  Rex Kras, [place], [facility]  Pager: [phone] Office: [phone]

## 2020-08-19 ENCOUNTER — Other Ambulatory Visit (HOSPITAL_COMMUNITY): Payer: Self-pay | Admitting: Family Medicine

## 2020-08-19 MED FILL — NEBIVOLOL HCL 5 MG TABS: 5 | 90 days supply | Qty: 90 | Fill #0

## 2020-08-25 ENCOUNTER — Other Ambulatory Visit: Payer: Self-pay

## 2020-08-25 ENCOUNTER — Ambulatory Visit: Payer: 59

## 2020-08-25 DIAGNOSIS — R072 Precordial pain: Secondary | ICD-10-CM

## 2020-08-29 ENCOUNTER — Ambulatory Visit: Payer: 59

## 2020-08-29 ENCOUNTER — Other Ambulatory Visit: Payer: Self-pay

## 2020-08-29 DIAGNOSIS — R072 Precordial pain: Secondary | ICD-10-CM | POA: Diagnosis not present

## 2020-09-05 NOTE — Progress Notes (Signed)
Called pt to inform her to keep her appt to go over her stress test. Pt understood.

## 2020-09-05 NOTE — Progress Notes (Signed)
Called pt to inform her about her echo results. Pt understood

## 2020-09-08 ENCOUNTER — Other Ambulatory Visit: Payer: Self-pay

## 2020-09-08 ENCOUNTER — Ambulatory Visit: Payer: 59 | Admitting: Cardiology

## 2020-09-08 ENCOUNTER — Other Ambulatory Visit: Payer: Self-pay | Admitting: Cardiology

## 2020-09-08 ENCOUNTER — Encounter: Payer: Self-pay | Admitting: Cardiology

## 2020-09-08 VITALS — BP 156/81 | HR 49 | Resp 16 | Ht 64.0 in | Wt 246.0 lb

## 2020-09-08 DIAGNOSIS — Z6841 Body Mass Index (BMI) 40.0 and over, adult: Secondary | ICD-10-CM

## 2020-09-08 DIAGNOSIS — Z712 Person consulting for explanation of examination or test findings: Secondary | ICD-10-CM

## 2020-09-08 DIAGNOSIS — I10 Essential (primary) hypertension: Secondary | ICD-10-CM

## 2020-09-08 DIAGNOSIS — Z8249 Family history of ischemic heart disease and other diseases of the circulatory system: Secondary | ICD-10-CM | POA: Diagnosis not present

## 2020-09-08 MED ORDER — LOSARTAN POTASSIUM 50 MG PO TABS: 50.0000 mg | ORAL_TABLET | Freq: Every evening | ORAL | 0 refills | Status: DC

## 2020-09-08 MED FILL — LOSARTAN POTASSIUM 50 MG TA: 50 | 90 days supply | Qty: 90 | Fill #0

## 2020-09-08 NOTE — Progress Notes (Signed)
Date:  09/08/2020   ID:  [name], DOB [birth year], MRN [id number]  PCP:  Iona Beard, MD  Cardiologist:  Rex Kras, DO, [facility] (established care 08/18/2020) Former Cardiology Providers: NA  Date: 09/08/20 Last Office Visit: 08/18/2020  Chief Complaint  Patient presents with  . Precordial pain  . Shortness of Breath  . Follow-up  . Results    HPI  Deborah Kennedy is a 59 y.o. female who presents to the office with a chief complaint of " reevaluation of chest pain and review test results." Patient's past medical history and cardiovascular risk factors include: Elevated blood pressures without diagnosis of hypertension, family history of premature CAD, obesity due to excess calories.  She is referred to the office at the request of Iona Beard, MD for evaluation of chest pain and shortness of breath.  The patient was last seen in consultation in October 2021 for evaluation of chest pain and shortness of breath.  Please evaluate chest pain was atypical in nature but given her multiple cardiovascular risk factors and ischemic work-up was recommended.  She underwent an echocardiogram which notes preserved LVEF with normal diastolic function and stress test reported overall a low risk study.  Clinically since last visit she denies recurrence of shortness of breath.  She is only had 1 episode of chest discomfort over the left anterior chest wall, piercing/sharp-like sensation, lasting for less than a minute, nonradiating, not brought on by effort related activities and does not resolve with rest.  She was also asked to keep a log of her blood pressures.  Review of her blood pressure log notes a systolic blood pressure consistently greater than 120/80 or majority of the days.  Patient states that she tries to follow a low-salt diet and eats healthy as much as possible.  FUNCTIONAL STATUS: Walks 3 miles per day.   ALLERGIES: Allergies  Allergen Reactions  .  Shellfish Allergy Itching  . Erythromycin Other (See Comments)    Unknown  . Other Itching and Other (See Comments)    Pt states that she had a reaction to her epidural during labor in 1996. Severe itching.    MEDICATION LIST PRIOR TO VISIT: Current Meds  Medication Sig  . acetaminophen (TYLENOL) 500 MG tablet Take 500 mg by mouth every 6 (six) hours as needed.  . APPLE CIDER VINEGAR PO Take by mouth.  Marland Kitchen aspirin EC 81 MG tablet Take 81 mg by mouth daily. Swallow whole.  Marland Kitchen BYSTOLIC 5 MG tablet Take 5 mg by mouth daily.  Marland Kitchen ibuprofen (ADVIL) 200 MG tablet Take 200 mg by mouth every 6 (six) hours as needed.  . Multiple Vitamins-Minerals (MULTIVITAMIN WITH MINERALS) tablet Take 1 tablet by mouth daily.     PAST MEDICAL HISTORY: Past Medical History:  Diagnosis Date  . Acute medial meniscus tear, right, subsequent encounter   . Anxiety   . Complication of anesthesia    reaction to epidural medication  . Depression   . Hypertension     PAST SURGICAL HISTORY: Past Surgical History:  Procedure Laterality Date  . ABDOMINAL HYSTERECTOMY  10/18/2011   Procedure: HYSTERECTOMY ABDOMINAL;  Surgeon: Jonnie Kind, MD;  Location: [facility];  Service: Gynecology;  Laterality: N/A;  . CERVICAL FUSION  2008  . KNEE ARTHROSCOPY WITH MEDIAL MENISECTOMY Right 06/14/2017   Procedure: RIGHT KNEE ARTHROSCOPY WITH MEDIAL MENISECTOMY;  Surgeon: Elsie Saas, MD;  Location: [facility];  Service: Orthopedics;  Laterality: Right;  . TOTAL KNEE ARTHROPLASTY  Left 12/01/2018   Procedure: TOTAL KNEE ARTHROPLASTY;  Surgeon: Gaynelle Arabian, MD;  Location: [facility];  Service: Orthopedics;  Laterality: Left;  95mn  . TUBAL LIGATION      FAMILY HISTORY: The patient family history includes CAD in her maternal grandmother and sister; Cancer in her father; Diabetes in her maternal grandmother; Heart Problems in her sister; Heart attack in her maternal grandfather; Heart failure in her maternal  grandmother; Hypertension in her mother.  SOCIAL HISTORY:  The patient  reports that she has never smoked. She has never used smokeless tobacco. She reports that she does not drink alcohol and does not use drugs.  REVIEW OF SYSTEMS: Review of Systems  Constitutional: Negative for chills and fever.  HENT: Negative for hoarse voice and nosebleeds.   Eyes: Negative for discharge, double vision and pain.  Cardiovascular: Positive for chest pain. Negative for claudication, leg swelling, near-syncope, orthopnea, palpitations, paroxysmal nocturnal dyspnea and syncope.  Respiratory: Negative for hemoptysis and shortness of breath.   Musculoskeletal: Positive for back pain and joint pain (knees). Negative for muscle cramps and myalgias.  Gastrointestinal: Negative for abdominal pain, constipation, diarrhea, hematemesis, hematochezia, melena, nausea and vomiting.  Neurological: Negative for dizziness and light-headedness.   PHYSICAL EXAM: Vitals with BMI 09/08/2020 08/18/2020 12/07/2018  Height 5' 4"  5' 4"  -  Weight 246 lbs 248 lbs -  BMI 485.46427.03-  Systolic [id number] Diastolic 81 82 72  Pulse 49 61 82    CONSTITUTIONAL: Well-developed and well-nourished. No acute distress.  SKIN: Skin is warm and dry. No rash noted. No cyanosis. No pallor. No jaundice HEAD: Normocephalic and atraumatic.  EYES: No scleral icterus MOUTH/THROAT: Moist oral membranes.  NECK: No JVD present. No thyromegaly noted. No carotid bruits  LYMPHATIC: No visible cervical adenopathy.  CHEST Normal respiratory effort. No intercostal retractions  LUNGS: Clear to auscultation bilaterally.  No stridor. No wheezes. No rales.  CARDIOVASCULAR: Regular rate and rhythm, positive S1-S2, no murmurs rubs or gallops appreciated ABDOMINAL: Obese, soft, nontender, nondistended, positive bowel sounds in all 4 quadrants, no apparent ascites.  EXTREMITIES: No peripheral edema, 2+ dorsalis pedis and posterior tibial  pulses. HEMATOLOGIC: No significant bruising NEUROLOGIC: Oriented to person, place, and time. Nonfocal. Normal muscle tone.  PSYCHIATRIC: Normal mood and affect. Normal behavior. Cooperative  CARDIAC DATABASE: EKG: 08/09/2020: Sinus bradycardia, 54 bpm, normal axis, without underlying ischemia or injury pattern.    Echocardiogram: 08/25/2020:  Normal LV systolic function with visual EF 55-60%. Left ventricle cavity is normal in size. Normal global wall motion. Normal diastolic filling pattern, normal LAP.  Mild (Grade I) mitral regurgitation.  Mild tricuspid regurgitation. No evidence of pulmonary hypertension.  No prior study for comparison.   Stress Testing: No results found for this or any previous visit from the past 1095 days.  Heart Catheterization: Lexiscan Tetrofosmin Stress Test 08/29/2020:  Nondiagnostic ECG stress. Lexiscan stress.  Myocardial perfusion is normal.  Overall LV systolic function is normal without regional wall motion abnormalities.  Stress LV EF: 65%.  No previous exam available for comparison. Low risk study.   LABORATORY DATA: CBC Latest Ref Rng & Units 12/07/2018 12/03/2018 12/02/2018  WBC 4.0 - 10.5 K/uL 5.8 7.5 7.3  Hemoglobin 12.0 - 15.0 g/dL 9.9(L) 11.0(L) 10.8(L)  Hematocrit 36 - 46 % 31.2(L) 35.8(L) 34.8(L)  Platelets 150 - 400 K/uL 201 171 179    CMP Latest Ref Rng & Units 12/07/2018 12/03/2018 12/02/2018  Glucose 70 - 99 mg/dL 139(H) 125(H) 144(H)  BUN 6 - 20 mg/dL 16 14 14   Creatinine 0.44 - 1.00 mg/dL 0.72 0.67 0.78  Sodium 135 - 145 mmol/L 135 140 138  Potassium 3.5 - 5.1 mmol/L 3.8 3.7 4.8  Chloride 98 - 111 mmol/L 102 108 109  CO2 22 - 32 mmol/L 25 24 21(L)  Calcium 8.9 - 10.3 mg/dL 8.7(L) 9.2 8.7(L)  Total Protein 6.5 - 8.1 g/dL - - -  Total Bilirubin 0.3 - 1.2 mg/dL - - -  Alkaline Phos 38 - 126 U/L - - -  AST 15 - 41 U/L - - -  ALT 0 - 44 U/L - - -   Lipid Panel     Component Value Date/Time   CHOL 128 10/31/2009 1842   TRIG 51  10/31/2009 1842   HDL 41 10/31/2009 1842   CHOLHDL 3.1 Ratio 10/31/2009 1842   VLDL 10 10/31/2009 1842   LDLCALC 77 10/31/2009 1842    No components found for: NTPROBNP No results for input(s): PROBNP in the last 8760 hours. No results for input(s): TSH in the last 8760 hours.  BMP No results for input(s): NA, K, CL, CO2, GLUCOSE, BUN, CREATININE, CALCIUM, GFRNONAA, GFRAA in the last 8760 hours.  HEMOGLOBIN A1C No results found for: HGBA1C, MPG   External Labs: Collected: 08/09/2020 Creatinine 0.84 mg/dL. eGFR: 90 mL/min per 1.73 m Lipid profile: Total cholesterol 193, triglycerides 139, HDL 45, LDL 132, non-HDL 148  IMPRESSION:    ICD-10-CM   1. Benign hypertension  Y77 Basic metabolic panel    Magnesium    losartan (COZAAR) 50 MG tablet  2. Family history of premature CAD  Z82.49   3. Class 3 severe obesity due to excess calories without serious comorbidity with body mass index (BMI) of 40.0 to 44.9 in adult (HCC)  E66.01    Z68.41   4. Encounter to discuss test results  Z71.2      RECOMMENDATIONS: Kushi Kun is a 59 y.o. female whose past medical history and cardiac risk factors include: Elevated blood pressures without diagnosis of hypertension, family history of premature CAD, obesity due to excess calories.  Precordial chest pain:  Patient symptoms of chest discomfort appear to be atypical in nature.  Echocardiogram and stress test results reviewed with the patient in great detail at today's office visit.  Independently reviewed the stress test images with the patient during today's encounter.  No additional cardiovascular work-up warranted at this time.  Continue to monitor symptoms.  Hypertension:  Patient requesting assistance in managing her hypertension.  She is currently on nebivolol.  Recommend losartan 50 mg p.o. nightly.  Check BMP and magnesium in 1 week to evaluate kidney function.  Low-salt diet recommended.  Also discussed  looking into the DASH diet and plant-based diet.  Patient is asked to call the office if her systolic blood pressures are consistently greater than 130 mmHg after the initiation of losartan.  Family history of premature CAD:  Recommended coronary artery calcium scoring for further stratification.  Patient would like to discuss this further with her PCP.  Primary prevention:  Educated on the importance of improving modifiable cardiovascular risk factors.  Reviewed her most recent lipid profile performed at her PCPs office her LDL is not at goal.  Patient would like to implement lifestyle modifications for 6 weeks and will have it rechecked with her PCP.  Educated on importance of weight loss by participating in moderate intensity exercise for 30 minutes a day 5 days a week and dietary  changes.  She is asked to be screened for diabetes when she has repeat blood work with her PCP.   FINAL MEDICATION LIST END OF ENCOUNTER: Meds ordered this encounter  Medications  . losartan (COZAAR) 50 MG tablet    Sig: Take 1 tablet (50 mg total) by mouth every evening.    Dispense:  90 tablet    Refill:  0     Current Outpatient Medications:  .  acetaminophen (TYLENOL) 500 MG tablet, Take 500 mg by mouth every 6 (six) hours as needed., Disp: , Rfl:  .  APPLE CIDER VINEGAR PO, Take by mouth., Disp: , Rfl:  .  aspirin EC 81 MG tablet, Take 81 mg by mouth daily. Swallow whole., Disp: , Rfl:  .  BYSTOLIC 5 MG tablet, Take 5 mg by mouth daily., Disp: , Rfl:  .  ibuprofen (ADVIL) 200 MG tablet, Take 200 mg by mouth every 6 (six) hours as needed., Disp: , Rfl:  .  Multiple Vitamins-Minerals (MULTIVITAMIN WITH MINERALS) tablet, Take 1 tablet by mouth daily., Disp: , Rfl:  .  losartan (COZAAR) 50 MG tablet, Take 1 tablet (50 mg total) by mouth every evening., Disp: 90 tablet, Rfl: 0  Orders Placed This Encounter  Procedures  . Basic metabolic panel  . Magnesium   There are no Patient Instructions on  file for this visit.   --Continue cardiac medications as reconciled in final medication list. --Return in about 3 months (around 12/09/2020) for Follow up, BP, Review test results. Or sooner if needed. --Continue follow-up with your primary care physician regarding the management of your other chronic comorbid conditions.  Patient's questions and concerns were addressed to her satisfaction. She voices understanding of the instructions provided during this encounter.   This note was created using a voice recognition software as a result there may be grammatical errors inadvertently enclosed that do not reflect the nature of this encounter. Every attempt is made to correct such errors.  Rex Kras, [place], [facility]  Pager: [phone] Office: [phone]

## 2020-09-12 ENCOUNTER — Ambulatory Visit: Payer: Self-pay | Admitting: Cardiology

## 2020-09-13 ENCOUNTER — Other Ambulatory Visit: Payer: Self-pay

## 2020-09-13 DIAGNOSIS — I10 Essential (primary) hypertension: Secondary | ICD-10-CM

## 2020-09-15 DIAGNOSIS — I10 Essential (primary) hypertension: Secondary | ICD-10-CM | POA: Diagnosis not present

## 2020-09-16 LAB — BASIC METABOLIC PANEL
BUN/Creatinine Ratio: 17 (ref 9–23)
BUN: 15 mg/dL (ref 6–24)
CO2: 21 mmol/L (ref 20–29)
Calcium: 9.3 mg/dL (ref 8.7–10.2)
Chloride: 104 mmol/L (ref 96–106)
Creatinine, Ser: 0.87 mg/dL (ref 0.57–1.00)
GFR calc Af Amer: 86 mL/min/{1.73_m2} (ref >59)
GFR calc non Af Amer: 74 mL/min/{1.73_m2} (ref >59)
Glucose: 104 mg/dL — ABNORMAL HIGH (ref 65–99)
Potassium: 4.1 mmol/L (ref 3.5–5.2)
Sodium: 142 mmol/L (ref 134–144)

## 2020-09-16 LAB — MAGNESIUM: Magnesium: 1.9 mg/dL (ref 1.6–2.3)

## 2020-09-21 NOTE — Progress Notes (Signed)
Unable to speak with patient. Unable to leave vm. Will try back at a later time.

## 2020-09-26 NOTE — Progress Notes (Signed)
Called pt, no answer. Unable to leave vm. Will try again later. AD/S

## 2020-09-27 NOTE — Progress Notes (Signed)
Called pt, no answer. Unable to leave vm. AD/S

## 2020-10-07 ENCOUNTER — Other Ambulatory Visit (HOSPITAL_COMMUNITY): Payer: Self-pay | Admitting: Family Medicine

## 2020-10-07 MED FILL — NEBIVOLOL HCL 10 MG TABS: 10 | 60 days supply | Qty: 60 | Fill #0

## 2020-11-15 ENCOUNTER — Other Ambulatory Visit (HOSPITAL_COMMUNITY): Payer: Self-pay | Admitting: Nurse Practitioner

## 2020-11-15 MED FILL — MECLIZINE 25 MG TABLET: 25 | 30 days supply | Qty: 30 | Fill #0

## 2020-11-24 DIAGNOSIS — R42 Dizziness and giddiness: Secondary | ICD-10-CM | POA: Diagnosis not present

## 2020-11-24 DIAGNOSIS — M2569 Stiffness of other specified joint, not elsewhere classified: Secondary | ICD-10-CM | POA: Diagnosis not present

## 2020-11-24 DIAGNOSIS — H8111 Benign paroxysmal vertigo, right ear: Secondary | ICD-10-CM | POA: Diagnosis not present

## 2020-11-24 DIAGNOSIS — R269 Unspecified abnormalities of gait and mobility: Secondary | ICD-10-CM | POA: Diagnosis not present

## 2020-12-06 ENCOUNTER — Ambulatory Visit: Payer: 59 | Admitting: Cardiology

## 2020-12-07 ENCOUNTER — Ambulatory Visit: Payer: 59 | Admitting: Cardiology

## 2020-12-07 ENCOUNTER — Encounter: Payer: Self-pay | Admitting: Cardiology

## 2020-12-07 ENCOUNTER — Other Ambulatory Visit: Payer: Self-pay

## 2020-12-07 DIAGNOSIS — I10 Essential (primary) hypertension: Secondary | ICD-10-CM | POA: Diagnosis not present

## 2020-12-07 MED ORDER — LOSARTAN POTASSIUM 50 MG PO TABS: 50.0000 mg | ORAL_TABLET | Freq: Every evening | ORAL | 1 refills | Status: DC

## 2020-12-07 NOTE — Progress Notes (Signed)
Date:  12/07/2020   ID:  Deborah Kennedy, DOB 12-Nov-1960, MRN [id number]  PCP:  Iona Beard, MD  Cardiologist:  Rex Kras, DO, [facility] (established care 08/18/2020) Former Cardiology Providers: NA  Date: 12/08/20 Last Office Visit: 09/08/2020  Chief Complaint  Patient presents with  . Benign hypertension  . Follow-up  . Results    HPI  Deborah Kennedy is a 60 y.o. female who presents to the office with a chief complaint of " reevaluation of blood pressures." Patient's past medical history and cardiovascular risk factors include: Elevated blood pressures without diagnosis of hypertension, family history of premature CAD, obesity due to excess calories.  She is referred to the office at the request of Iona Beard, MD for evaluation of chest pain and shortness of breath.  Patient was originally referred to the practice back in October 2021 for chest pain and shortness of breath.  She underwent an ischemic evaluation which was overall unremarkable.  Her dyspnea was thought to be secondary to uncontrolled hypertension.  She was started on pharmacological therapy and has tolerated the medications well without any side effects or intolerances.  Her shortness of breath has improved since the initiation of antihypertensive medications.  She is here for follow-up.  She denies any cardiovascular symptoms.  No hospitalizations or urgent care visits since last office encounter.  Patient states that her home blood pressures are very well controlled with systolic blood pressures ranging between 737-106 mmHg and diastolic blood pressures around 80 mmHg.  I do not have a blood pressure log for review during today's encounter.  Since last visit patient states that she has been having some issues with vertigo and was in physical therapy but it was discontinued prematurely due to her becoming symptomatic.  Defer to primary team.  FUNCTIONAL STATUS: Walks 2-3 miles per day and has started a  stationary bike.   ALLERGIES: Allergies  Allergen Reactions  . Shellfish Allergy Itching  . Erythromycin Other (See Comments)    Unknown  . Other Itching and Other (See Comments)    Pt states that she had a reaction to her epidural during labor in 1996. Severe itching.    MEDICATION LIST PRIOR TO VISIT: Current Meds  Medication Sig  . acetaminophen (TYLENOL) 500 MG tablet Take 500 mg by mouth every 6 (six) hours as needed.  Marland Kitchen aspirin EC 81 MG tablet Take 81 mg by mouth daily. Swallow whole.  Marland Kitchen BYSTOLIC 5 MG tablet Take 5 mg by mouth daily.  Marland Kitchen ibuprofen (ADVIL) 200 MG tablet Take 200 mg by mouth every 6 (six) hours as needed.  . Multiple Vitamins-Minerals (MULTIVITAMIN WITH MINERALS) tablet Take 1 tablet by mouth daily.  . [DISCONTINUED] losartan (COZAAR) 50 MG tablet Take 1 tablet (50 mg total) by mouth every evening.     PAST MEDICAL HISTORY: Past Medical History:  Diagnosis Date  . Acute medial meniscus tear, right, subsequent encounter   . Anxiety   . Complication of anesthesia    reaction to epidural medication  . Depression   . Hypertension     PAST SURGICAL HISTORY: Past Surgical History:  Procedure Laterality Date  . ABDOMINAL HYSTERECTOMY  10/18/2011   Procedure: HYSTERECTOMY ABDOMINAL;  Surgeon: Jonnie Kind, MD;  Location: [facility];  Service: Gynecology;  Laterality: N/A;  . CERVICAL FUSION  2008  . KNEE ARTHROSCOPY WITH MEDIAL MENISECTOMY Right 06/14/2017   Procedure: RIGHT KNEE ARTHROSCOPY WITH MEDIAL MENISECTOMY;  Surgeon: Elsie Saas, MD;  Location: [facility];  Service: Orthopedics;  Laterality: Right;  . TOTAL KNEE ARTHROPLASTY Left 12/01/2018   Procedure: TOTAL KNEE ARTHROPLASTY;  Surgeon: Gaynelle Arabian, MD;  Location: [facility];  Service: Orthopedics;  Laterality: Left;  18mn  . TUBAL LIGATION      FAMILY HISTORY: The patient family history includes CAD in her maternal grandmother and sister; Cancer in her father; Diabetes in her  maternal grandmother; Heart Problems in her sister; Heart attack in her maternal grandfather; Heart failure in her maternal grandmother; Hypertension in her mother.  SOCIAL HISTORY:  The patient  reports that she has never smoked. She has never used smokeless tobacco. She reports that she does not drink alcohol and does not use drugs.  REVIEW OF SYSTEMS: Review of Systems  Constitutional: Negative for chills and fever.  HENT: Negative for hoarse voice and nosebleeds.   Eyes: Negative for discharge, double vision and pain.  Cardiovascular: Negative for chest pain, claudication, leg swelling, near-syncope, orthopnea, palpitations, paroxysmal nocturnal dyspnea and syncope.  Respiratory: Negative for hemoptysis and shortness of breath.   Musculoskeletal: Positive for back pain and joint pain (knees). Negative for muscle cramps and myalgias.  Gastrointestinal: Negative for abdominal pain, constipation, diarrhea, hematemesis, hematochezia, melena, nausea and vomiting.  Neurological: Positive for vertigo. Negative for dizziness and light-headedness.   PHYSICAL EXAM: Vitals with BMI 12/07/2020 09/08/2020 08/18/2020  Height 5' 4"  5' 4"  5' 4"   Weight - 246 lbs 248 lbs  BMI - 409.32435.57 Systolic [id number] Diastolic 72 81 82  Pulse 62 49 61    CONSTITUTIONAL: Well-developed and well-nourished. No acute distress.  SKIN: Skin is warm and dry. No rash noted. No cyanosis. No pallor. No jaundice HEAD: Normocephalic and atraumatic.  EYES: No scleral icterus MOUTH/THROAT: Moist oral membranes.  NECK: No JVD present. No thyromegaly noted. No carotid bruits  LYMPHATIC: No visible cervical adenopathy.  CHEST Normal respiratory effort. No intercostal retractions  LUNGS: Clear to auscultation bilaterally.  No stridor. No wheezes. No rales.  CARDIOVASCULAR: Regular rate and rhythm, positive S1-S2, no murmurs rubs or gallops appreciated ABDOMINAL: Obese, soft, nontender, nondistended, positive bowel  sounds in all 4 quadrants, no apparent ascites.  EXTREMITIES: No peripheral edema, 2+ dorsalis pedis and posterior tibial pulses. HEMATOLOGIC: No significant bruising NEUROLOGIC: Oriented to person, place, and time. Nonfocal. Normal muscle tone.  PSYCHIATRIC: Normal mood and affect. Normal behavior. Cooperative  CARDIAC DATABASE: EKG: 08/09/2020: Sinus bradycardia, 54 bpm, normal axis, without underlying ischemia or injury pattern.    Echocardiogram: 08/25/2020:  Normal LV systolic function with visual EF 55-60%. Left ventricle cavity is normal in size. Normal global wall motion. Normal diastolic filling pattern, normal LAP.  Mild (Grade I) mitral regurgitation.  Mild tricuspid regurgitation. No evidence of pulmonary hypertension.  No prior study for comparison.   Stress Testing: No results found for this or any previous visit from the past 1095 days.  Heart Catheterization: Lexiscan Tetrofosmin Stress Test 08/29/2020:  Nondiagnostic ECG stress. Lexiscan stress.  Myocardial perfusion is normal.  Overall LV systolic function is normal without regional wall motion abnormalities.  Stress LV EF: 65%.  No previous exam available for comparison. Low risk study.   LABORATORY DATA: CBC Latest Ref Rng & Units 12/07/2018 12/03/2018 12/02/2018  WBC 4.0 - 10.5 K/uL 5.8 7.5 7.3  Hemoglobin 12.0 - 15.0 g/dL 9.9(L) 11.0(L) 10.8(L)  Hematocrit 36.0 - 46.0 % 31.2(L) 35.8(L) 34.8(L)  Platelets 150 - 400 K/uL 201 171 179    CMP Latest Ref Rng &  Units 09/15/2020 12/07/2018 12/03/2018  Glucose 65 - 99 mg/dL 104(H) 139(H) 125(H)  BUN 6 - 24 mg/dL 15 16 14   Creatinine 0.57 - 1.00 mg/dL 0.87 0.72 0.67  Sodium 134 - 144 mmol/L 142 135 140  Potassium 3.5 - 5.2 mmol/L 4.1 3.8 3.7  Chloride 96 - 106 mmol/L 104 102 108  CO2 20 - 29 mmol/L 21 25 24   Calcium 8.7 - 10.2 mg/dL 9.3 8.7(L) 9.2  Total Protein 6.5 - 8.1 g/dL - - -  Total Bilirubin 0.3 - 1.2 mg/dL - - -  Alkaline Phos 38 - 126 U/L - - -  AST 15 -  41 U/L - - -  ALT 0 - 44 U/L - - -   Lipid Panel     Component Value Date/Time   CHOL 128 10/31/2009 1842   TRIG 51 10/31/2009 1842   HDL 41 10/31/2009 1842   CHOLHDL 3.1 Ratio 10/31/2009 1842   VLDL 10 10/31/2009 1842   LDLCALC 77 10/31/2009 1842    No components found for: NTPROBNP No results for input(s): PROBNP in the last 8760 hours. No results for input(s): TSH in the last 8760 hours.  BMP Recent Labs    09/15/20 1311  NA 142  K 4.1  CL 104  CO2 21  GLUCOSE 104*  BUN 15  CREATININE 0.87  CALCIUM 9.3  GFRNONAA 74  GFRAA 86    HEMOGLOBIN A1C No results found for: HGBA1C, MPG   External Labs: Collected: 08/09/2020 Creatinine 0.84 mg/dL. eGFR: 90 mL/min per 1.73 m Lipid profile: Total cholesterol 193, triglycerides 139, HDL 45, LDL 132, non-HDL 148  IMPRESSION:    ICD-10-CM   1. Benign hypertension  I10 losartan (COZAAR) 50 MG tablet     RECOMMENDATIONS: Deborah Kennedy is a 60 y.o. female whose past medical history and cardiac risk factors include: Elevated blood pressures without diagnosis of hypertension, family history of premature CAD, obesity due to excess calories.  Hypertension:  Patient requesting assistance in managing her hypertension.  She is currently on nebivolol.  Continue losartan 50 mg p.o. nightly.  Medications refilled.  Labs from September 15, 2020 independently reviewed.  Patient's creatinine and electrolytes remain stable after the initiation of ARB's.  Low-salt diet recommended.  Also discussed looking into the DASH diet and plant-based diet.  Patient is asked to call the office if her systolic blood pressures are consistently greater than 130 mmHg after the initiation of losartan.  Family history of premature CAD:  Recommended coronary artery calcium scoring for further stratification.  Patient would like to discuss this further with her PCP.  Total time spent: 20 minutes.  FINAL MEDICATION LIST END OF  ENCOUNTER: Meds ordered this encounter  Medications  . losartan (COZAAR) 50 MG tablet    Sig: Take 1 tablet (50 mg total) by mouth every evening.    Dispense:  90 tablet    Refill:  1     Current Outpatient Medications:  .  acetaminophen (TYLENOL) 500 MG tablet, Take 500 mg by mouth every 6 (six) hours as needed., Disp: , Rfl:  .  aspirin EC 81 MG tablet, Take 81 mg by mouth daily. Swallow whole., Disp: , Rfl:  .  BYSTOLIC 5 MG tablet, Take 5 mg by mouth daily., Disp: , Rfl:  .  ibuprofen (ADVIL) 200 MG tablet, Take 200 mg by mouth every 6 (six) hours as needed., Disp: , Rfl:  .  Multiple Vitamins-Minerals (MULTIVITAMIN WITH MINERALS) tablet, Take 1 tablet by  mouth daily., Disp: , Rfl:  .  losartan (COZAAR) 50 MG tablet, Take 1 tablet (50 mg total) by mouth every evening., Disp: 90 tablet, Rfl: 1  No orders of the defined types were placed in this encounter.  There are no Patient Instructions on file for this visit.   --Continue cardiac medications as reconciled in final medication list. --Return in about 6 months (around 06/06/2021) for Follow up, BP. Or sooner if needed. --Continue follow-up with your primary care physician regarding the management of your other chronic comorbid conditions.  Patient's questions and concerns were addressed to her satisfaction. She voices understanding of the instructions provided during this encounter.   This note was created using a voice recognition software as a result there may be grammatical errors inadvertently enclosed that do not reflect the nature of this encounter. Every attempt is made to correct such errors.  Rex Kras, [place], [facility]  Pager: [phone] Office: [phone]

## 2020-12-13 ENCOUNTER — Other Ambulatory Visit (HOSPITAL_COMMUNITY): Payer: Self-pay | Admitting: Family Medicine

## 2020-12-13 MED FILL — NEBIVOLOL HCL 10 MG TABS: 10 | 60 days supply | Qty: 60 | Fill #0

## 2020-12-30 ENCOUNTER — Other Ambulatory Visit: Payer: Self-pay | Admitting: Cardiology

## 2020-12-30 ENCOUNTER — Other Ambulatory Visit: Payer: Self-pay

## 2020-12-30 DIAGNOSIS — I10 Essential (primary) hypertension: Secondary | ICD-10-CM

## 2020-12-30 MED ORDER — LOSARTAN POTASSIUM 50 MG PO TABS: 50.0000 mg | ORAL_TABLET | Freq: Every evening | ORAL | 1 refills | Status: DC

## 2020-12-30 MED FILL — LOSARTAN POTASSIUM 50 MG TA: 50 | 30 days supply | Qty: 30 | Fill #0

## 2021-02-06 ENCOUNTER — Other Ambulatory Visit (HOSPITAL_COMMUNITY): Payer: Self-pay

## 2021-02-06 MED FILL — Losartan Potassium Tab 50 MG: ORAL | 90 days supply | Qty: 90 | Fill #0 | Status: AC

## 2021-02-07 DIAGNOSIS — R42 Dizziness and giddiness: Secondary | ICD-10-CM | POA: Diagnosis not present

## 2021-02-07 DIAGNOSIS — H903 Sensorineural hearing loss, bilateral: Secondary | ICD-10-CM | POA: Diagnosis not present

## 2021-02-10 ENCOUNTER — Other Ambulatory Visit (HOSPITAL_COMMUNITY): Payer: Self-pay

## 2021-02-10 MED ORDER — MUPIROCIN 2 % EX OINT
1.0000 "application " | TOPICAL_OINTMENT | Freq: Three times a day (TID) | CUTANEOUS | 0 refills | Status: AC | PRN
  Filled 2021-02-10: qty 22, 8d supply, fill #0

## 2021-02-20 ENCOUNTER — Other Ambulatory Visit (HOSPITAL_COMMUNITY): Payer: Self-pay

## 2021-02-23 ENCOUNTER — Other Ambulatory Visit (HOSPITAL_COMMUNITY): Payer: Self-pay

## 2021-02-23 MED ORDER — NEBIVOLOL HCL 10 MG PO TABS
10.0000 mg | ORAL_TABLET | Freq: Every day | ORAL | 1 refills | Status: DC
  Filled 2021-02-23: qty 60, 60d supply, fill #0

## 2021-02-24 ENCOUNTER — Other Ambulatory Visit (HOSPITAL_COMMUNITY): Payer: Self-pay

## 2021-03-14 DIAGNOSIS — H903 Sensorineural hearing loss, bilateral: Secondary | ICD-10-CM | POA: Diagnosis not present

## 2021-03-14 DIAGNOSIS — R42 Dizziness and giddiness: Secondary | ICD-10-CM | POA: Diagnosis not present

## 2021-04-19 DIAGNOSIS — R300 Dysuria: Secondary | ICD-10-CM | POA: Diagnosis not present

## 2021-04-19 DIAGNOSIS — E7800 Pure hypercholesterolemia, unspecified: Secondary | ICD-10-CM | POA: Diagnosis not present

## 2021-04-19 DIAGNOSIS — I10 Essential (primary) hypertension: Secondary | ICD-10-CM | POA: Diagnosis not present

## 2021-04-19 DIAGNOSIS — Z6841 Body Mass Index (BMI) 40.0 and over, adult: Secondary | ICD-10-CM | POA: Diagnosis not present

## 2021-05-05 ENCOUNTER — Other Ambulatory Visit (HOSPITAL_COMMUNITY): Payer: Self-pay

## 2021-05-05 MED ORDER — NEBIVOLOL HCL 10 MG PO TABS
10.0000 mg | ORAL_TABLET | Freq: Every day | ORAL | 1 refills | Status: DC
  Filled 2021-05-05: qty 90, 90d supply, fill #0
  Filled 2021-08-02: qty 30, 30d supply, fill #1

## 2021-05-09 DIAGNOSIS — Z1231 Encounter for screening mammogram for malignant neoplasm of breast: Secondary | ICD-10-CM | POA: Diagnosis not present

## 2021-05-09 DIAGNOSIS — Z01419 Encounter for gynecological examination (general) (routine) without abnormal findings: Secondary | ICD-10-CM | POA: Diagnosis not present

## 2021-05-09 DIAGNOSIS — Z6841 Body Mass Index (BMI) 40.0 and over, adult: Secondary | ICD-10-CM | POA: Diagnosis not present

## 2021-05-11 DIAGNOSIS — H5213 Myopia, bilateral: Secondary | ICD-10-CM | POA: Diagnosis not present

## 2021-05-31 ENCOUNTER — Other Ambulatory Visit (HOSPITAL_COMMUNITY): Payer: Self-pay

## 2021-05-31 MED FILL — Losartan Potassium Tab 50 MG: ORAL | 60 days supply | Qty: 60 | Fill #1 | Status: AC

## 2021-06-07 ENCOUNTER — Ambulatory Visit: Payer: 59 | Admitting: Cardiology

## 2021-06-07 ENCOUNTER — Telehealth: Payer: Self-pay | Admitting: Cardiology

## 2021-06-07 DIAGNOSIS — I10 Essential (primary) hypertension: Secondary | ICD-10-CM

## 2021-06-07 NOTE — Telephone Encounter (Signed)
Pt called today and pushed her monitor appt back from 06/07/21 to 06/08/21 due to her not being able to meet the appt time.

## 2021-06-08 ENCOUNTER — Other Ambulatory Visit: Payer: Self-pay

## 2021-06-08 ENCOUNTER — Other Ambulatory Visit (HOSPITAL_COMMUNITY): Payer: Self-pay

## 2021-06-08 ENCOUNTER — Ambulatory Visit: Payer: 59 | Admitting: Cardiology

## 2021-06-08 ENCOUNTER — Encounter: Payer: Self-pay | Admitting: Cardiology

## 2021-06-08 VITALS — BP 146/86 | HR 53 | Resp 16 | Ht 64.0 in | Wt 234.2 lb

## 2021-06-08 DIAGNOSIS — Z8249 Family history of ischemic heart disease and other diseases of the circulatory system: Secondary | ICD-10-CM

## 2021-06-08 DIAGNOSIS — I10 Essential (primary) hypertension: Secondary | ICD-10-CM | POA: Diagnosis not present

## 2021-06-08 DIAGNOSIS — Z6841 Body Mass Index (BMI) 40.0 and over, adult: Secondary | ICD-10-CM

## 2021-06-08 MED ORDER — LOSARTAN POTASSIUM 50 MG PO TABS
50.0000 mg | ORAL_TABLET | Freq: Every evening | ORAL | 1 refills | Status: DC
  Filled 2021-06-08: qty 90, fill #0
  Filled 2021-08-21: qty 90, 90d supply, fill #0
  Filled 2021-11-30: qty 90, 90d supply, fill #1

## 2021-06-08 NOTE — Progress Notes (Signed)
Date:  06/08/2021   ID:  Deborah Kennedy, DOB 12/21/1960, MRN [id number]  PCP:  Deborah Beard, MD  Cardiologist:  Rex Kras, DO, [facility] (established care 08/18/2020) Former Cardiology Providers: NA  Date: 06/08/21 Last Office Visit: 12/07/2020  Chief Complaint  Patient presents with   Benign hypertension   Follow-up    HPI  Deborah Kennedy is a 60 y.o. female who presents to the office with a chief complaint of " 58-monthfollow-up for blood pressure management." Patient's past medical history and cardiovascular risk factors include: Hypertension, family history of premature CAD, obesity due to excess calories.  She is referred to the office at the request of HIona Beard MD for evaluation of chest pain and shortness of breath.  Since establishing care back in October 2021 she has undergone ischemic evaluation which is overall unremarkable but reviewed as a part of decision-making process today's office visit and noted below for further reference.  Since initiation of antihypertensive medications for shortness of breath with effort related activities has improved significantly.  In addition, she has been exercising more and eating healthier and has lost at least 12 pounds since last office visit.  She is congratulated on her efforts.  Patient states that she recently followed up with her PCP and has had labs.  I do not have those labs to review I have asked her to either have them faxed over or drop them off for reference.  Patient is requesting refills on losartan.  FUNCTIONAL STATUS: Walks 2-3 miles per day and stationary bike for 417m.   ALLERGIES: Allergies  Allergen Reactions   Shellfish Allergy Itching   Erythromycin Other (See Comments)    Unknown   Other Itching and Other (See Comments)    Pt states that she had a reaction to her epidural during labor in 1996. Severe itching.    MEDICATION LIST PRIOR TO VISIT: Current Meds  Medication Sig    acetaminophen (TYLENOL) 500 MG tablet Take 500 mg by mouth every 6 (six) hours as needed.   BYSTOLIC 5 MG tablet Take 5 mg by mouth daily.   ibuprofen (ADVIL) 200 MG tablet Take 200 mg by mouth every 6 (six) hours as needed.   mupirocin ointment (BACTROBAN) 2 % Apply 1 application topically 3 (three) times daily as needed for impetigo   nebivolol (BYSTOLIC) 10 MG tablet Take 1 tablet (10 mg total) by mouth daily.   [DISCONTINUED] losartan (COZAAR) 50 MG tablet TAKE 1 TABLET (50 MG TOTAL) BY MOUTH EVERY EVENING.     PAST MEDICAL HISTORY: Past Medical History:  Diagnosis Date   Acute medial meniscus tear, right, subsequent encounter    Anxiety    Complication of anesthesia    reaction to epidural medication   Depression    Hypertension     PAST SURGICAL HISTORY: Past Surgical History:  Procedure Laterality Date   ABDOMINAL HYSTERECTOMY  10/18/2011   Procedure: HYSTERECTOMY ABDOMINAL;  Surgeon: JoJonnie KindMD;  Location: [facility];  Service: Gynecology;  Laterality: N/A;   CERVICAL FUSION  2008   KNEE ARTHROSCOPY WITH MEDIAL MENISECTOMY Right 06/14/2017   Procedure: RIGHT KNEE ARTHROSCOPY WITH MEDIAL MENISECTOMY;  Surgeon: WaElsie SaasMD;  [facility] Service: Orthopedics;  Laterality: Right;   TOTAL KNEE ARTHROPLASTY Left 12/01/2018   Procedure: TOTAL KNEE ARTHROPLASTY;  Surgeon: AlGaynelle ArabianMD;  Location: [facility];  Service: Orthopedics;  Laterality: Left;  5030m  TUBAL LIGATION  FAMILY HISTORY: The patient family history includes CAD in her maternal grandmother and sister; Cancer in her father; Diabetes in her maternal grandmother; Heart Problems in her sister; Heart attack in her maternal grandfather; Heart failure in her maternal grandmother; Hypertension in her mother.  SOCIAL HISTORY:  The patient  reports that she has never smoked. She has never used smokeless tobacco. She reports that she does not drink alcohol and does not use  drugs.  REVIEW OF SYSTEMS: Review of Systems  Constitutional: Negative for chills and fever.  HENT:  Negative for hoarse voice and nosebleeds.   Eyes:  Negative for discharge, double vision and pain.  Cardiovascular:  Negative for chest pain, claudication, leg swelling, near-syncope, orthopnea, palpitations, paroxysmal nocturnal dyspnea and syncope.  Respiratory:  Negative for hemoptysis and shortness of breath.   Musculoskeletal:  Positive for back pain and joint pain (knees). Negative for muscle cramps and myalgias.  Gastrointestinal:  Negative for abdominal pain, constipation, diarrhea, hematemesis, hematochezia, melena, nausea and vomiting.  Neurological:  Positive for vertigo. Negative for dizziness and light-headedness.  PHYSICAL EXAM: Vitals with BMI 06/08/2021 12/07/2020 09/08/2020  Height 5' 4"  5' 4"  5' 4"   Weight 234 lbs 3 oz - 246 lbs  BMI 02.77 - 41.28  Systolic 786 767 209  Diastolic 86 72 81  Pulse 53 62 49    CONSTITUTIONAL: Well-developed and well-nourished. No acute distress.  SKIN: Skin is warm and dry. No rash noted. No cyanosis. No pallor. No jaundice HEAD: Normocephalic and atraumatic.  EYES: No scleral icterus MOUTH/THROAT: Moist oral membranes.  NECK: No JVD present. No thyromegaly noted. No carotid bruits  LYMPHATIC: No visible cervical adenopathy.  CHEST Normal respiratory effort. No intercostal retractions  LUNGS: Clear to auscultation bilaterally.  No stridor. No wheezes. No rales.  CARDIOVASCULAR: Regular rate and rhythm, positive S1-S2, no murmurs rubs or gallops appreciated ABDOMINAL: Obese, soft, nontender, nondistended, positive bowel sounds in all 4 quadrants, no apparent ascites.  EXTREMITIES: No peripheral edema, 2+ dorsalis pedis and posterior tibial pulses. HEMATOLOGIC: No significant bruising NEUROLOGIC: Oriented to person, place, and time. Nonfocal. Normal muscle tone.  PSYCHIATRIC: Normal mood and affect. Normal behavior. Cooperative  CARDIAC  DATABASE: EKG: 06/08/2021: Sinus bradycardia, 53 bpm, normal axis, without underlying injury pattern.   Echocardiogram: 08/25/2020:  Normal LV systolic function with visual EF 55-60%. Left ventricle cavity is normal in size. Normal global wall motion. Normal diastolic filling pattern, normal LAP.  Mild (Grade I) mitral regurgitation.  Mild tricuspid regurgitation. No evidence of pulmonary hypertension.  No prior study for comparison.   Stress Testing: No results found for this or any previous visit from the past 1095 days.  Heart Catheterization: Lexiscan Tetrofosmin Stress Test  08/29/2020:  Nondiagnostic ECG stress. Lexiscan stress.  Myocardial perfusion is normal.  Overall LV systolic function is normal without regional wall motion abnormalities.  Stress LV EF: 65%.  No previous exam available for comparison. Low risk study.   LABORATORY DATA: CBC Latest Ref Rng & Units 12/07/2018 12/03/2018 12/02/2018  WBC 4.0 - 10.5 K/uL 5.8 7.5 7.3  Hemoglobin 12.0 - 15.0 g/dL 9.9(L) 11.0(L) 10.8(L)  Hematocrit 36.0 - 46.0 % 31.2(L) 35.8(L) 34.8(L)  Platelets 150 - 400 K/uL 201 171 179    CMP Latest Ref Rng & Units 09/15/2020 12/07/2018 12/03/2018  Glucose 65 - 99 mg/dL 104(H) 139(H) 125(H)  BUN 6 - 24 mg/dL 15 16 14   Creatinine 0.57 - 1.00 mg/dL 0.87 0.72 0.67  Sodium 134 - 144 mmol/L 142 135 140  Potassium 3.5 - 5.2 mmol/L 4.1 3.8 3.7  Chloride 96 - 106 mmol/L 104 102 108  CO2 20 - 29 mmol/L 21 25 24   Calcium 8.7 - 10.2 mg/dL 9.3 8.7(L) 9.2  Total Protein 6.5 - 8.1 g/dL - - -  Total Bilirubin 0.3 - 1.2 mg/dL - - -  Alkaline Phos 38 - 126 U/L - - -  AST 15 - 41 U/L - - -  ALT 0 - 44 U/L - - -   Lipid Panel     Component Value Date/Time   CHOL 128 10/31/2009 1842   TRIG 51 10/31/2009 1842   HDL 41 10/31/2009 1842   CHOLHDL 3.1 Ratio 10/31/2009 1842   VLDL 10 10/31/2009 1842   LDLCALC 77 10/31/2009 1842    No components found for: NTPROBNP No results for input(s): PROBNP in the last  8760 hours. No results for input(s): TSH in the last 8760 hours.  BMP Recent Labs    09/15/20 1311  NA 142  K 4.1  CL 104  CO2 21  GLUCOSE 104*  BUN 15  CREATININE 0.87  CALCIUM 9.3  GFRNONAA 74  GFRAA 86    HEMOGLOBIN A1C No results found for: HGBA1C, MPG   External Labs: Collected: 08/09/2020 Creatinine 0.84 mg/dL. eGFR: 90 mL/min per 1.73 m Lipid profile: Total cholesterol 193, triglycerides 139, HDL 45, LDL 132, non-HDL 148  IMPRESSION:    ICD-10-CM   1. Benign hypertension  I10 EKG 12-Lead    losartan (COZAAR) 50 MG tablet    2. Family history of premature CAD  Z82.49     3. Class 3 severe obesity due to excess calories without serious comorbidity with body mass index (BMI) of 40.0 to 44.9 in adult [facility])  E66.01    Z68.41        RECOMMENDATIONS: Dion Parrow is a 60 y.o. female whose past medical history and cardiac risk factors include: Hypertension, family history of premature CAD, obesity due to excess calories.  Hypertension: Medications reconciled.   Has not been checking her home blood pressures.   Low-salt diet recommended.   Losartan refilled. Patient is asked to keep a log of her blood pressures to see if additional medication titration is warranted.  Family history of premature CAD: Recommended coronary artery calcium scoring for further stratification.   Patient would like to discuss this further with her PCP. Patient states that she recently had a lipid profile with her PCP and the shared decision was to start statin therapy.  She is awaiting the prescriptions to be sent to her pharmacy.  Obesity, due to excess calories: Body mass index is 40.2 kg/m. Patient has lost approximately 12 pounds since last office visit.  She is congratulated on her efforts. I reviewed with the patient the importance of diet, regular physical activity/exercise, weight loss.   Patient is educated on increasing physical activity gradually as tolerated.   With the goal of moderate intensity exercise for 30 minutes a day 5 days a week.  Total time spent: 33 minutes.  FINAL MEDICATION LIST END OF ENCOUNTER: Meds ordered this encounter  Medications   losartan (COZAAR) 50 MG tablet    Sig: Take 1 tablet (50 mg total) by mouth every evening.    Dispense:  90 tablet    Refill:  1     Current Outpatient Medications:    acetaminophen (TYLENOL) 500 MG tablet, Take 500 mg by mouth every 6 (six) hours as needed., Disp: , Rfl:  BYSTOLIC 5 MG tablet, Take 5 mg by mouth daily., Disp: , Rfl:    ibuprofen (ADVIL) 200 MG tablet, Take 200 mg by mouth every 6 (six) hours as needed., Disp: , Rfl:    mupirocin ointment (BACTROBAN) 2 %, Apply 1 application topically 3 (three) times daily as needed for impetigo, Disp: 22 g, Rfl: 0   nebivolol (BYSTOLIC) 10 MG tablet, Take 1 tablet (10 mg total) by mouth daily., Disp: 60 tablet, Rfl: 1   losartan (COZAAR) 50 MG tablet, Take 1 tablet (50 mg total) by mouth every evening., Disp: 90 tablet, Rfl: 1  Orders Placed This Encounter  Procedures   EKG 12-Lead    There are no Patient Instructions on file for this visit.   --Continue cardiac medications as reconciled in final medication list. --Return in about 6 months (around 12/09/2021) for Follow up, BP. Or sooner if needed. --Continue follow-up with your primary care physician regarding the management of your other chronic comorbid conditions.  Patient's questions and concerns were addressed to her satisfaction. She voices understanding of the instructions provided during this encounter.   This note was created using a voice recognition software as a result there may be grammatical errors inadvertently enclosed that do not reflect the nature of this encounter. Every attempt is made to correct such errors.  Rex Kras, [place], [facility]  Pager: [phone] Office: [phone]

## 2021-08-02 ENCOUNTER — Other Ambulatory Visit (HOSPITAL_COMMUNITY): Payer: Self-pay

## 2021-08-09 ENCOUNTER — Other Ambulatory Visit (HOSPITAL_COMMUNITY): Payer: Self-pay

## 2021-08-09 DIAGNOSIS — R102 Pelvic and perineal pain: Secondary | ICD-10-CM | POA: Diagnosis not present

## 2021-08-09 DIAGNOSIS — N952 Postmenopausal atrophic vaginitis: Secondary | ICD-10-CM | POA: Diagnosis not present

## 2021-08-09 MED ORDER — CIPROFLOXACIN HCL 250 MG PO TABS
250.0000 mg | ORAL_TABLET | Freq: Two times a day (BID) | ORAL | 0 refills | Status: DC
  Filled 2021-08-09: qty 14, 7d supply, fill #0

## 2021-08-09 MED ORDER — ESTRADIOL 0.1 MG/GM VA CREA
0.5000 g | TOPICAL_CREAM | VAGINAL | 3 refills | Status: DC
  Filled 2021-08-09 – 2021-08-10 (×2): qty 42.5, 90d supply, fill #0

## 2021-08-10 ENCOUNTER — Other Ambulatory Visit (HOSPITAL_COMMUNITY): Payer: Self-pay

## 2021-08-10 MED ORDER — PREMARIN 0.625 MG/GM VA CREA
0.5000 g | TOPICAL_CREAM | VAGINAL | 3 refills | Status: DC
  Filled 2021-08-10 (×2): qty 30, 90d supply, fill #0

## 2021-08-21 ENCOUNTER — Other Ambulatory Visit (HOSPITAL_COMMUNITY): Payer: Self-pay

## 2021-08-29 ENCOUNTER — Other Ambulatory Visit (HOSPITAL_COMMUNITY): Payer: Self-pay

## 2021-08-29 MED ORDER — NEBIVOLOL HCL 10 MG PO TABS
10.0000 mg | ORAL_TABLET | Freq: Every day | ORAL | 4 refills | Status: AC
  Filled 2021-08-29: qty 60, 60d supply, fill #0
  Filled 2021-10-27: qty 60, 60d supply, fill #1
  Filled 2022-01-01: qty 90, 90d supply, fill #2
  Filled 2022-04-06: qty 90, 90d supply, fill #3

## 2021-10-24 DIAGNOSIS — N909 Noninflammatory disorder of vulva and perineum, unspecified: Secondary | ICD-10-CM | POA: Diagnosis not present

## 2021-10-27 ENCOUNTER — Other Ambulatory Visit (HOSPITAL_COMMUNITY): Payer: Self-pay

## 2021-11-30 ENCOUNTER — Other Ambulatory Visit (HOSPITAL_COMMUNITY): Payer: Self-pay

## 2021-12-14 ENCOUNTER — Ambulatory Visit: Payer: 59 | Admitting: Cardiology

## 2021-12-21 DIAGNOSIS — I10 Essential (primary) hypertension: Secondary | ICD-10-CM | POA: Diagnosis not present

## 2021-12-21 DIAGNOSIS — Z1211 Encounter for screening for malignant neoplasm of colon: Secondary | ICD-10-CM | POA: Diagnosis not present

## 2022-01-01 ENCOUNTER — Other Ambulatory Visit (HOSPITAL_COMMUNITY): Payer: Self-pay

## 2022-03-22 ENCOUNTER — Other Ambulatory Visit: Payer: Self-pay

## 2022-03-22 ENCOUNTER — Other Ambulatory Visit (HOSPITAL_COMMUNITY): Payer: Self-pay

## 2022-03-22 DIAGNOSIS — I10 Essential (primary) hypertension: Secondary | ICD-10-CM

## 2022-03-22 MED ORDER — LOSARTAN POTASSIUM 50 MG PO TABS
50.0000 mg | ORAL_TABLET | Freq: Every evening | ORAL | 0 refills | Status: DC
  Filled 2022-03-22: qty 30, 30d supply, fill #0

## 2022-03-23 ENCOUNTER — Other Ambulatory Visit (HOSPITAL_COMMUNITY): Payer: Self-pay

## 2022-04-06 ENCOUNTER — Other Ambulatory Visit (HOSPITAL_COMMUNITY): Payer: Self-pay

## 2022-04-09 ENCOUNTER — Other Ambulatory Visit (HOSPITAL_COMMUNITY): Payer: Self-pay

## 2022-04-09 MED ORDER — LOSARTAN POTASSIUM 50 MG PO TABS
50.0000 mg | ORAL_TABLET | Freq: Every evening | ORAL | 1 refills | Status: AC
  Filled 2022-04-09: qty 90, 90d supply, fill #0
  Filled 2022-07-25: qty 90, 90d supply, fill #1

## 2022-04-10 ENCOUNTER — Other Ambulatory Visit (HOSPITAL_COMMUNITY): Payer: Self-pay

## 2022-04-16 ENCOUNTER — Other Ambulatory Visit (HOSPITAL_COMMUNITY): Payer: Self-pay

## 2022-04-17 ENCOUNTER — Other Ambulatory Visit (HOSPITAL_COMMUNITY): Payer: Self-pay

## 2022-07-09 ENCOUNTER — Other Ambulatory Visit (HOSPITAL_COMMUNITY): Payer: Self-pay

## 2022-07-09 MED ORDER — NEBIVOLOL HCL 10 MG PO TABS
10.0000 mg | ORAL_TABLET | Freq: Every day | ORAL | 1 refills | Status: DC
  Filled 2022-07-09: qty 90, 90d supply, fill #0
  Filled 2022-10-01: qty 90, 90d supply, fill #1

## 2022-07-25 ENCOUNTER — Other Ambulatory Visit (HOSPITAL_COMMUNITY): Payer: Self-pay

## 2022-10-01 ENCOUNTER — Other Ambulatory Visit (HOSPITAL_COMMUNITY): Payer: Self-pay

## 2022-10-26 DIAGNOSIS — M25561 Pain in right knee: Secondary | ICD-10-CM | POA: Diagnosis not present

## 2022-10-26 DIAGNOSIS — I10 Essential (primary) hypertension: Secondary | ICD-10-CM | POA: Diagnosis not present

## 2022-10-26 DIAGNOSIS — Z6841 Body Mass Index (BMI) 40.0 and over, adult: Secondary | ICD-10-CM | POA: Diagnosis not present

## 2022-10-26 DIAGNOSIS — E663 Overweight: Secondary | ICD-10-CM | POA: Diagnosis not present

## 2022-10-26 DIAGNOSIS — N3941 Urge incontinence: Secondary | ICD-10-CM | POA: Diagnosis not present

## 2022-10-26 DIAGNOSIS — E785 Hyperlipidemia, unspecified: Secondary | ICD-10-CM | POA: Diagnosis not present

## 2022-10-30 ENCOUNTER — Other Ambulatory Visit (HOSPITAL_COMMUNITY): Payer: Self-pay

## 2022-10-30 MED ORDER — LOSARTAN POTASSIUM 50 MG PO TABS
50.0000 mg | ORAL_TABLET | Freq: Every day | ORAL | 1 refills | Status: DC
  Filled 2022-10-30: qty 90, 90d supply, fill #0
  Filled 2023-01-28 – 2023-02-27 (×3): qty 90, 90d supply, fill #1

## 2022-11-20 ENCOUNTER — Other Ambulatory Visit (HOSPITAL_COMMUNITY): Payer: Self-pay

## 2022-11-20 MED ORDER — NEBIVOLOL HCL 10 MG PO TABS
10.0000 mg | ORAL_TABLET | Freq: Every day | ORAL | 1 refills | Status: DC
  Filled 2022-11-20 – 2022-12-28 (×2): qty 90, 90d supply, fill #0
  Filled 2023-03-28: qty 90, 90d supply, fill #1

## 2022-11-20 MED ORDER — ATORVASTATIN CALCIUM 20 MG PO TABS
20.0000 mg | ORAL_TABLET | Freq: Every evening | ORAL | 0 refills | Status: AC
  Filled 2022-11-20 – 2022-12-28 (×2): qty 90, 90d supply, fill #0

## 2022-11-29 ENCOUNTER — Other Ambulatory Visit (HOSPITAL_COMMUNITY): Payer: Self-pay

## 2022-12-28 ENCOUNTER — Other Ambulatory Visit (HOSPITAL_COMMUNITY): Payer: Self-pay

## 2023-01-15 DIAGNOSIS — R635 Abnormal weight gain: Secondary | ICD-10-CM | POA: Diagnosis not present

## 2023-01-15 DIAGNOSIS — Z86010 Personal history of colonic polyps: Secondary | ICD-10-CM | POA: Diagnosis not present

## 2023-01-15 DIAGNOSIS — Z1211 Encounter for screening for malignant neoplasm of colon: Secondary | ICD-10-CM | POA: Diagnosis not present

## 2023-01-15 DIAGNOSIS — I10 Essential (primary) hypertension: Secondary | ICD-10-CM | POA: Diagnosis not present

## 2023-01-28 ENCOUNTER — Other Ambulatory Visit (HOSPITAL_COMMUNITY): Payer: Self-pay

## 2023-01-28 DIAGNOSIS — E7800 Pure hypercholesterolemia, unspecified: Secondary | ICD-10-CM | POA: Diagnosis not present

## 2023-01-28 DIAGNOSIS — Z6841 Body Mass Index (BMI) 40.0 and over, adult: Secondary | ICD-10-CM | POA: Diagnosis not present

## 2023-01-28 DIAGNOSIS — N3941 Urge incontinence: Secondary | ICD-10-CM | POA: Diagnosis not present

## 2023-01-28 DIAGNOSIS — I10 Essential (primary) hypertension: Secondary | ICD-10-CM | POA: Diagnosis not present

## 2023-01-28 DIAGNOSIS — E6609 Other obesity due to excess calories: Secondary | ICD-10-CM | POA: Diagnosis not present

## 2023-01-28 DIAGNOSIS — E785 Hyperlipidemia, unspecified: Secondary | ICD-10-CM | POA: Diagnosis not present

## 2023-01-28 MED ORDER — SEMAGLUTIDE-WEIGHT MANAGEMENT 0.25 MG/0.5ML ~~LOC~~ SOAJ
0.2500 mg | SUBCUTANEOUS | 0 refills | Status: AC
  Filled 2023-01-28: qty 2, 28d supply, fill #0

## 2023-01-29 ENCOUNTER — Other Ambulatory Visit: Payer: Self-pay

## 2023-01-29 ENCOUNTER — Other Ambulatory Visit (HOSPITAL_COMMUNITY): Payer: Self-pay

## 2023-02-12 DIAGNOSIS — Z1231 Encounter for screening mammogram for malignant neoplasm of breast: Secondary | ICD-10-CM | POA: Diagnosis not present

## 2023-02-25 DIAGNOSIS — N3941 Urge incontinence: Secondary | ICD-10-CM | POA: Diagnosis not present

## 2023-02-25 DIAGNOSIS — M25561 Pain in right knee: Secondary | ICD-10-CM | POA: Diagnosis not present

## 2023-02-25 DIAGNOSIS — I10 Essential (primary) hypertension: Secondary | ICD-10-CM | POA: Diagnosis not present

## 2023-02-25 DIAGNOSIS — E785 Hyperlipidemia, unspecified: Secondary | ICD-10-CM | POA: Diagnosis not present

## 2023-02-25 DIAGNOSIS — E6609 Other obesity due to excess calories: Secondary | ICD-10-CM | POA: Diagnosis not present

## 2023-02-27 ENCOUNTER — Other Ambulatory Visit (HOSPITAL_COMMUNITY): Payer: Self-pay

## 2023-03-01 DIAGNOSIS — M25571 Pain in right ankle and joints of right foot: Secondary | ICD-10-CM | POA: Diagnosis not present

## 2023-03-01 DIAGNOSIS — M76821 Posterior tibial tendinitis, right leg: Secondary | ICD-10-CM | POA: Diagnosis not present

## 2023-03-01 DIAGNOSIS — M21619 Bunion of unspecified foot: Secondary | ICD-10-CM | POA: Diagnosis not present

## 2023-04-18 ENCOUNTER — Other Ambulatory Visit (HOSPITAL_COMMUNITY): Payer: Self-pay

## 2023-04-18 MED ORDER — SEMAGLUTIDE-WEIGHT MANAGEMENT 0.25 MG/0.5ML ~~LOC~~ SOAJ
0.2500 mg | SUBCUTANEOUS | 0 refills | Status: AC
  Filled 2023-04-18: qty 2, 28d supply, fill #0

## 2023-04-18 MED ORDER — ATORVASTATIN CALCIUM 20 MG PO TABS
20.0000 mg | ORAL_TABLET | Freq: Every day | ORAL | 0 refills | Status: AC
  Filled 2023-04-18: qty 90, 90d supply, fill #0

## 2023-04-19 ENCOUNTER — Other Ambulatory Visit (HOSPITAL_COMMUNITY): Payer: Self-pay

## 2023-05-13 ENCOUNTER — Encounter (HOSPITAL_COMMUNITY): Payer: Self-pay

## 2023-05-13 ENCOUNTER — Other Ambulatory Visit: Payer: Self-pay

## 2023-05-13 ENCOUNTER — Emergency Department (HOSPITAL_COMMUNITY): Payer: 59

## 2023-05-13 ENCOUNTER — Emergency Department (HOSPITAL_COMMUNITY)
Admission: EM | Admit: 2023-05-13 | Discharge: 2023-05-14 | Disposition: A | Discharge status: Home / Self Care | Payer: 59 | Attending: Emergency Medicine | Admitting: Emergency Medicine

## 2023-05-13 DIAGNOSIS — Z6841 Body Mass Index (BMI) 40.0 and over, adult: Secondary | ICD-10-CM | POA: Diagnosis not present

## 2023-05-13 DIAGNOSIS — R079 Chest pain, unspecified: Secondary | ICD-10-CM | POA: Insufficient documentation

## 2023-05-13 DIAGNOSIS — R0789 Other chest pain: Secondary | ICD-10-CM | POA: Diagnosis not present

## 2023-05-13 DIAGNOSIS — I10 Essential (primary) hypertension: Secondary | ICD-10-CM | POA: Insufficient documentation

## 2023-05-13 DIAGNOSIS — Z79899 Other long term (current) drug therapy: Secondary | ICD-10-CM | POA: Diagnosis not present

## 2023-05-13 LAB — TROPONIN I (HIGH SENSITIVITY)
Troponin I (High Sensitivity): 3 ng/L (ref <18)
Troponin I (High Sensitivity): 4 ng/L (ref <18)

## 2023-05-13 LAB — BASIC METABOLIC PANEL
Anion gap: 9 (ref 5–15)
BUN: 13 mg/dL (ref 6–20)
CO2: 21 mmol/L — ABNORMAL LOW (ref 22–32)
Calcium: 9.1 mg/dL (ref 8.9–10.3)
Chloride: 108 mmol/L (ref 98–111)
Creatinine, Ser: 0.85 mg/dL (ref 0.44–1.00)
GFR, Estimated: >60 mL/min (ref >60)
Glucose, Bld: 101 mg/dL — ABNORMAL HIGH (ref 70–99)
Potassium: 3.7 mmol/L (ref 3.5–5.1)
Sodium: 138 mmol/L (ref 135–145)

## 2023-05-13 LAB — CBC
HCT: 38.4 % (ref 36.0–46.0)
Hemoglobin: 12.3 g/dL (ref 12.0–15.0)
MCH: 27.9 pg (ref 26.0–34.0)
MCHC: 32 g/dL (ref 30.0–36.0)
MCV: 87.1 fL (ref 80.0–100.0)
Platelets: 187 10*3/uL (ref 150–400)
RBC: 4.41 MIL/uL (ref 3.87–5.11)
RDW: 13.1 % (ref 11.5–15.5)
WBC: 4 10*3/uL (ref 4.0–10.5)
nRBC: 0 % (ref 0.0–0.2)

## 2023-05-13 NOTE — ED Provider Triage Note (Signed)
Emergency Medicine Provider Triage Evaluation Note  Deborah Kennedy , a 62 y.o. female  was evaluated in triage.  Pt complains of chest pain off and on for over a week. No SOB. No exacerbating or relieving factors. Was [facility] here from [facility] for abnormal E[facility].  Review of Systems  Positive:  Negative:   Physical Exam  BP (!) 158/86 (BP Location: Right Arm)   Pulse (!) 55   Temp 98.4 F (36.9 C)   Resp 17   LMP 09/19/2011   SpO2 97%  Gen:   Awake, no distress   Resp:  Normal effort  MSK:   Moves extremities without difficulty  Other:  bradycardia  Medical Decision Making  Medically screening exam initiated at 7:33 PM.  Appropriate orders placed.  Deborah Kennedy was informed that the remainder of the evaluation will be completed by another provider, this initial triage assessment does not replace that evaluation, and the importance of remaining in the ED until their evaluation is complete.  EKG and chest pain work up initiated.    Deborah Rich, PA-C 05/13/23 1935

## 2023-05-13 NOTE — ED Triage Notes (Signed)
Pt arrives from home via POV c/o chest pain, sharp intermittent 2-3/10 on pain scale. Currently no chest pain. Denies any sob, n/v diaphoresis

## 2023-05-14 NOTE — Discharge Instructions (Signed)
Your evaluation in the emergency department today was reassuring.  We recommend follow-up with your cardiologist for further evaluation of your chest pain.  It would be beneficial for you to have a stress test completed within the next 30 days to further investigate the cause of your symptoms.  You may return to the ED for any worsening chest pain or other new/concerning symptoms.

## 2023-05-14 NOTE — ED Provider Notes (Signed)
[place] EMERGENCY DEPARTMENT AT [facility] Provider Note   CSN: [id number] Arrival date & time: 05/13/23  1844     History  Chief Complaint  Patient presents with   Chest Pain    Deborah Kennedy is a 62 y.o. female.  62 year old female with a history of hypertension, anxiety, depression presents to the emergency department for evaluation of chest pain.  She began to notice chest pain approximately 1 week ago.  Pain remained intermittent.  Often present in her left upper to mid chest without radiation.  She denies any known modifying factors of her pain.  Pain would occur at rest and was not noted to be aggravated by exertion.  Not worse with breathing.  She has not had any associated shortness of breath, diaphoresis, syncope.  No new or worsening leg swelling, fevers.  She took a baby aspirin on 1 previous occasion without much change to her symptoms.  Presently reports that her pain has eased off since arrival.  The history is provided by the patient. No language interpreter was used.  Chest Pain      Home Medications Prior to Admission medications   Medication Sig Start Date End Date Taking? Authorizing Provider  acetaminophen (TYLENOL) 500 MG tablet Take 500 mg by mouth every 6 (six) hours as needed.    [provider]  atorvastatin (LIPITOR) 20 MG tablet Take 1 tablet (20 mg total) by mouth at bedtime for cholesterol. 11/20/22     atorvastatin (LIPITOR) 20 MG tablet Take 1 tablet (20 mg total) by mouth at bedtime for cholesterol 04/18/23     BYSTOLIC 5 MG tablet Take 5 mg by mouth daily. 05/06/20   [provider]  ciprofloxacin (CIPRO) 250 MG tablet Take 1 tablet (250 mg total) by mouth 2 (two) times daily for 7 days 08/09/21   Mirna Mires, MD  conjugated estrogens (PREMARIN) vaginal cream Insert 0.5 grams vaginally 2 (two) times a week as directed. 08/10/21     estradiol (ESTRACE) 0.1 MG/GM vaginal cream Insert 0.5 g vaginally 2 (two) times  a week. 08/10/21     ibuprofen (ADVIL) 200 MG tablet Take 200 mg by mouth every 6 (six) hours as needed.    [provider]  losartan (COZAAR) 50 MG tablet Take 1 tablet (50 mg total) by mouth at bedtime. 04/09/22     losartan (COZAAR) 50 MG tablet Take 1 tablet (50 mg total) by mouth at bedtime. 10/30/22     mupirocin ointment (BACTROBAN) 2 % Apply 1 application topically 3 (three) times daily as needed for impetigo 02/09/21     nebivolol (BYSTOLIC) 10 MG tablet Take 1 tablet (10 mg total) by mouth daily. 08/29/21     nebivolol (BYSTOLIC) 10 MG tablet Take 1 tablet (10 mg total) by mouth daily. 11/20/22     Semaglutide-Weight Management 0.25 MG/0.5ML SOAJ Inject 0.25 mg into the skin once a week. 01/28/23   Mirna Mires, MD  Semaglutide-Weight Management 0.25 MG/0.5ML SOAJ Inject 0.25 mg into the skin once a week, 30 minutes before 1st meal of the day 04/18/23         Allergies    Shellfish allergy, Erythromycin, and Other    Review of Systems   Review of Systems  Cardiovascular:  Positive for chest pain.  Ten systems reviewed and are negative for acute change, except as noted in the HPI.    Physical Exam Updated Vital Signs BP 130/77   Pulse 80   Temp 97.6  F (36.4 C) (Oral)   Resp 18   Ht 5\' 6"  (1.676 m)   Wt 104 kg   LMP 09/19/2011   SpO2 100%   BMI 37.01 kg/m   Physical Exam Vitals and nursing note reviewed.  Constitutional:      General: She is not in acute distress.    Appearance: She is well-developed. She is not diaphoretic.     Comments: Obese AA female. Patient anxious appearing, nontoxic.  HENT:     Head: Normocephalic and atraumatic.  Eyes:     General: No scleral icterus.    Extraocular Movements: EOM normal.     Conjunctiva/sclera: Conjunctivae normal.  Cardiovascular:     Rate and Rhythm: Normal rate and regular rhythm.     Pulses: Normal pulses.  Pulmonary:     Effort: Pulmonary effort is normal. No respiratory distress.     Breath sounds: No  stridor. No wheezing.     Comments: Respirations even and unlabored. Lungs CTAB. Musculoskeletal:        General: Normal range of motion.     Cervical back: Normal range of motion.     Right lower leg: No edema.     Left lower leg: No edema.     Comments: No pitting BLE edema.  Skin:    General: Skin is warm and dry.     Coloration: Skin is not pale.     Findings: No erythema or rash.  Neurological:     Mental Status: She is alert and oriented to person, place, and time.     Coordination: Coordination normal.  Psychiatric:        Mood and Affect: Mood and affect normal.        Behavior: Behavior normal.     ED Results / Procedures / Treatments   Labs (all labs ordered are listed, but only abnormal results are displayed) Labs Reviewed  BASIC METABOLIC PANEL - Abnormal; Notable for the following components:      Result Value   CO2 21 (*)    Glucose, Bld 101 (*)    All other components within normal limits  CBC  TROPONIN I (HIGH SENSITIVITY)  TROPONIN I (HIGH SENSITIVITY)    EKG EKG Interpretation Date/Time:  Monday May 13 2023 19:12:28 EDT Ventricular Rate:  52 PR Interval:  182 QRS Duration:  102 QT Interval:  416 QTC Calculation: 386 R Axis:   51  Text Interpretation: Sinus bradycardia Otherwise normal ECG No significant change since last tracing Confirmed by Zadie Rhine ([id number]) on 05/14/2023 12:22:59 AM  Radiology DG Chest 2 View  Result Date: 05/13/2023 CLINICAL DATA:  Chest pain for about a week. EXAM: CHEST - 2 VIEW COMPARISON:  None Available. FINDINGS: Shallow inspiration. Heart size and pulmonary vascularity are normal. Lungs are clear. No pleural effusions. No pneumothorax. Mediastinal contours appear intact. Postoperative changes in the cervical spine. IMPRESSION: No active cardiopulmonary disease. Electronically Signed   By: Burman Nieves M.D.   On: 05/13/2023 20:16    Procedures Procedures    Medications Ordered in ED Medications - No data  to display  ED Course/ Medical Decision Making/ A&P                             Medical Decision Making  This patient presents to the ED for concern of chest pain, this involves an extensive number of treatment options, and is a complaint that carries with it a high  risk of complications and morbidity.  The differential diagnosis includes MSK vs ACS vs PE vs PTX vs pleural effusion vs PUD vs Boerhaave's.   Co morbidities that complicate the patient evaluation  HTN Anxiety  Depression    Additional history obtained:  Additional history obtained from spouse, at bedside External records from outside source obtained and reviewed including myocardial perfusion study in 2021 which was low risk   Lab Tests:  I Ordered, and personally interpreted labs.  The pertinent results include:  CO2 21, Glucose 101. Otherwise normal work up. Troponin negative x2.   Imaging Studies ordered:  I ordered imaging studies including CXR  I independently visualized and interpreted imaging which showed no acute cardiopulmonary abnormality. I agree with the radiologist interpretation   Cardiac Monitoring:  The patient was maintained on a cardiac monitor.  I personally viewed and interpreted the cardiac monitored which showed an underlying rhythm of: sinus bradycardia   Medicines ordered and prescription drug management:  I have reviewed the patients home medicines and have made adjustments as needed   Test Considered:  BNP   Problem List / ED Course:  Low suspicion for emergent cardiac etiology given reassuring workup today.  Chest pain presentation atypical for ACS.  EKG is nonischemic and troponin negative x2.   Chest x-ray without evidence of mediastinal widening to suggest dissection.  No pneumothorax, pneumonia, pleural effusion.   Per review of the patient's chart, she is actively followed by cardiology.  She had a reassuring myocardial perfusion study in 2021.  Would likely benefit  from repeat outpatient stress test in the next 30 days.  Discussed importance of following up with her cardiologist as well as her primary doctor.   Reevaluation:  After the interventions noted above, I reevaluated the patient and found that they have :improved   Social Determinants of Health:  Good social support Insured patient   Dispostion:  After consideration of the diagnostic results and the patients response to treatment, I feel that the patent would benefit from outpatient cardiology follow up as well as coordination of outpatient stress test. Work up tonight reassuring; no emergent cause of pain identified. Return precautions discussed and provided. Patient discharged in stable condition with no unaddressed concerns.          Final Clinical Impression(s) / ED Diagnoses Final diagnoses:  Nonspecific chest pain    Rx / DC Orders ED Discharge Orders     None         Antony Madura, PA-C 05/14/23 3710    Zadie Rhine, MD 05/14/23 [phone]

## 2023-05-27 ENCOUNTER — Other Ambulatory Visit (HOSPITAL_COMMUNITY): Payer: Self-pay

## 2023-05-27 DIAGNOSIS — Z6841 Body Mass Index (BMI) 40.0 and over, adult: Secondary | ICD-10-CM | POA: Diagnosis not present

## 2023-05-27 DIAGNOSIS — R001 Bradycardia, unspecified: Secondary | ICD-10-CM | POA: Diagnosis not present

## 2023-05-27 DIAGNOSIS — E663 Overweight: Secondary | ICD-10-CM | POA: Diagnosis not present

## 2023-05-27 DIAGNOSIS — R079 Chest pain, unspecified: Secondary | ICD-10-CM | POA: Diagnosis not present

## 2023-05-27 DIAGNOSIS — E7800 Pure hypercholesterolemia, unspecified: Secondary | ICD-10-CM | POA: Diagnosis not present

## 2023-05-27 DIAGNOSIS — I10 Essential (primary) hypertension: Secondary | ICD-10-CM | POA: Diagnosis not present

## 2023-05-27 DIAGNOSIS — N3941 Urge incontinence: Secondary | ICD-10-CM | POA: Diagnosis not present

## 2023-05-27 MED ORDER — ATORVASTATIN CALCIUM 20 MG PO TABS
20.0000 mg | ORAL_TABLET | Freq: Every day | ORAL | 0 refills | Status: AC
  Filled 2023-05-27: qty 90, 90d supply, fill #0

## 2023-05-27 MED ORDER — NEBIVOLOL HCL 10 MG PO TABS
10.0000 mg | ORAL_TABLET | Freq: Every day | ORAL | 1 refills | Status: DC
  Filled 2023-05-27 – 2023-06-25 (×3): qty 90, 90d supply, fill #0
  Filled 2023-09-13: qty 90, 90d supply, fill #1

## 2023-05-27 MED ORDER — PANTOPRAZOLE SODIUM 40 MG PO TBEC
40.0000 mg | DELAYED_RELEASE_TABLET | Freq: Every day | ORAL | 0 refills | Status: DC
  Filled 2023-05-27: qty 90, 90d supply, fill #0

## 2023-05-27 MED ORDER — LOSARTAN POTASSIUM 50 MG PO TABS
50.0000 mg | ORAL_TABLET | Freq: Every day | ORAL | 1 refills | Status: DC
  Filled 2023-05-27: qty 90, 90d supply, fill #0
  Filled 2023-08-19: qty 90, 90d supply, fill #1

## 2023-05-30 ENCOUNTER — Other Ambulatory Visit (HOSPITAL_COMMUNITY): Payer: Self-pay

## 2023-06-25 ENCOUNTER — Other Ambulatory Visit (HOSPITAL_COMMUNITY): Payer: Self-pay

## 2023-07-22 ENCOUNTER — Other Ambulatory Visit (HOSPITAL_COMMUNITY): Payer: Self-pay

## 2023-07-22 DIAGNOSIS — E7800 Pure hypercholesterolemia, unspecified: Secondary | ICD-10-CM | POA: Diagnosis not present

## 2023-07-22 DIAGNOSIS — I10 Essential (primary) hypertension: Secondary | ICD-10-CM | POA: Diagnosis not present

## 2023-07-22 MED ORDER — SEMAGLUTIDE-WEIGHT MANAGEMENT 0.25 MG/0.5ML ~~LOC~~ SOAJ
0.2500 mg | SUBCUTANEOUS | 0 refills | Status: AC
Start: 2023-07-22 — End: ?

## 2023-07-22 MED ORDER — SEMAGLUTIDE-WEIGHT MANAGEMENT 0.25 MG/0.5ML ~~LOC~~ SOAJ
0.2500 mg | SUBCUTANEOUS | 0 refills | Status: AC
Start: 2023-07-22 — End: ?
  Filled 2023-07-22 – 2023-08-19 (×4): qty 2, 28d supply, fill #0

## 2023-08-01 ENCOUNTER — Other Ambulatory Visit (HOSPITAL_COMMUNITY): Payer: Self-pay

## 2023-08-13 ENCOUNTER — Ambulatory Visit: Payer: 59 | Admitting: Cardiology

## 2023-08-19 ENCOUNTER — Encounter: Payer: Self-pay | Admitting: Cardiology

## 2023-08-19 ENCOUNTER — Ambulatory Visit: Payer: 59 | Attending: Cardiology | Admitting: Cardiology

## 2023-08-19 ENCOUNTER — Other Ambulatory Visit (HOSPITAL_COMMUNITY): Payer: Self-pay

## 2023-08-19 VITALS — BP 140/86 | HR 59 | Resp 16 | Ht 66.0 in | Wt 259.0 lb

## 2023-08-19 DIAGNOSIS — E66813 Obesity, class 3: Secondary | ICD-10-CM | POA: Diagnosis not present

## 2023-08-19 DIAGNOSIS — I10 Essential (primary) hypertension: Secondary | ICD-10-CM

## 2023-08-19 DIAGNOSIS — E669 Obesity, unspecified: Secondary | ICD-10-CM | POA: Diagnosis not present

## 2023-08-19 DIAGNOSIS — Z6841 Body Mass Index (BMI) 40.0 and over, adult: Secondary | ICD-10-CM | POA: Diagnosis not present

## 2023-08-19 DIAGNOSIS — R072 Precordial pain: Secondary | ICD-10-CM

## 2023-08-19 DIAGNOSIS — R0609 Other forms of dyspnea: Secondary | ICD-10-CM | POA: Diagnosis not present

## 2023-08-19 DIAGNOSIS — Z8249 Family history of ischemic heart disease and other diseases of the circulatory system: Secondary | ICD-10-CM | POA: Diagnosis not present

## 2023-08-19 DIAGNOSIS — R0789 Other chest pain: Secondary | ICD-10-CM | POA: Diagnosis not present

## 2023-08-19 DIAGNOSIS — R0600 Dyspnea, unspecified: Secondary | ICD-10-CM | POA: Diagnosis not present

## 2023-08-19 DIAGNOSIS — R001 Bradycardia, unspecified: Secondary | ICD-10-CM | POA: Diagnosis not present

## 2023-08-19 MED ORDER — METOPROLOL TARTRATE 25 MG PO TABS
25.0000 mg | ORAL_TABLET | Freq: Once | ORAL | 0 refills | Status: AC
  Filled 2023-08-19: qty 1, 1d supply, fill #0

## 2023-08-19 MED ORDER — PREDNISONE 50 MG PO TABS
ORAL_TABLET | ORAL | 0 refills | Status: AC
  Filled 2023-08-19: qty 3, 1d supply, fill #0

## 2023-08-19 MED ORDER — SEMAGLUTIDE-WEIGHT MANAGEMENT 0.25 MG/0.5ML ~~LOC~~ SOAJ
0.2500 mg | SUBCUTANEOUS | 0 refills | Status: AC
  Filled 2023-08-19: qty 2, 28d supply, fill #0

## 2023-08-19 MED ORDER — DIPHENHYDRAMINE HCL 50 MG PO CAPS
ORAL_CAPSULE | ORAL | 0 refills | Status: AC
  Filled 2023-08-19: qty 1, fill #0

## 2023-08-19 NOTE — Patient Instructions (Addendum)
Medication Instructions:  Your physician has recommended you make the following change in your medication:   Take Metoprolol Tartrate (Lopressor) 25 mg once 2 hours before your cardiac CT  *If you need a refill on your cardiac medications before your next appointment, please call your pharmacy*  Lab Work: TODAY BMP   If you have labs (blood work) drawn today and your tests are completely normal, you will receive your results only by: MyChart Message (if you have MyChart) OR A paper copy in the mail If you have any lab test that is abnormal or we need to change your treatment, we will call you to review the results.  Testing/Procedures: Your physician has requested that you have cardiac CT. Cardiac computed tomography (CT) is a painless test that uses an x-ray machine to take clear, detailed pictures of your heart. For further information please visit [email or web address] Please follow instruction sheet as given.   Follow-Up: At [facility] you and your health needs are our priority.  As part of our continuing mission to provide you with exceptional heart care, we have created designated Provider Care Teams.  These Care Teams include your primary Cardiologist (physician) and Advanced Practice Providers (APPs -  Physician Assistants and Nurse Practitioners) who all work together to provide you with the care you need, when you need it.  We recommend signing up for the patient portal called "MyChart".  Sign up information is provided on this After Visit Summary.  MyChart is used to connect with patients for Virtual Visits (Telemedicine).  Patients are able to view lab/test results, encounter notes, upcoming appointments, etc.  Non-urgent messages can be sent to your provider as well.   To learn more about what you can do with MyChart, go to [email or web address].    Your next appointment:   1 year(s) or sooner depending on results of Cardiac CT  The format for your next appointment:    In Person  Provider:   Tessa Lerner, DO {  Other Instructions   Your cardiac CT will be scheduled at one of the below locations:   [facility] [address] [place], [place] [address] [phone]  If scheduled at [facility], please arrive at the [facility] and Children's Entrance (Entrance C2) of [facility] 30 minutes prior to test start time. You can use the FREE valet parking offered at entrance C (encouraged to control the heart rate for the test)  Proceed to the [facility] Radiology Department (first floor) to check-in and test prep.  All radiology patients and guests should use entrance C2 at [facility], accessed from [facility], even though the hospital's physical address listed is [address]    Please follow these instructions carefully (unless otherwise directed):  An IV will be required for this test and Nitroglycerin will be given.  Hold all erectile dysfunction medications at least 3 days (72 hrs) prior to test. (Ie viagra, cialis, sildenafil, tadalafil, etc)   On the Night Before the Test: Be sure to Drink plenty of water. Do not consume any caffeinated/decaffeinated beverages or chocolate 12 hours prior to your test. Do not take any antihistamines 12 hours prior to your test. If the patient has contrast allergy: Patient will need a prescription for Prednisone and very clear instructions (as follows): Prednisone 50 mg - take 13 hours prior to test Take another Prednisone 50 mg 7 hours prior to test Take another Prednisone 50 mg 1 hour prior to test  Take Benadryl 50 mg 1 hour prior to test Patient must complete all four doses of above prophylactic medications. Patient will need a ride after test due to Benadryl.  On the Day of the Test: Drink plenty of water until 1 hour prior to the test. Do not eat any food 1 hour prior to test. You may take your regular medications prior to the test.  Take  metoprolol (Lopressor) two hours prior to test. FEMALES- please wear underwire-free bra if available, avoid dresses & tight clothing      After the Test: Drink plenty of water. After receiving IV contrast, you may experience a mild flushed feeling. This is normal. On occasion, you may experience a mild rash up to 24 hours after the test. This is not dangerous. If this occurs, you can take Benadryl 25 mg and increase your fluid intake. If you experience trouble breathing, this can be serious. If it is severe call 911 IMMEDIATELY. If it is mild, please call our office. If you take any of these medications: Glipizide/Metformin, Avandament, Glucavance, please do not take 48 hours after completing test unless otherwise instructed.  We will call to schedule your test 2-4 weeks out understanding that some insurance companies will need an authorization prior to the service being performed.   For more information and frequently asked questions, please visit our website : [email or web address]  For non-scheduling related questions, please contact the cardiac imaging nurse navigator should you have any questions/concerns: Cardiac Imaging Nurse Navigators Direct Office Dial: [phone]   For scheduling needs, including cancellations and rescheduling, please call [place], [phone].

## 2023-08-19 NOTE — Progress Notes (Signed)
Cardiology Office Note:  .   Date:  08/19/2023  ID:  Deborah Kennedy, DOB [birth year], MRN [id number] PCP:  Mirna Mires, MD  Former Cardiology Providers: NA [facility] Providers Cardiologist:  Tessa Lerner, DO , [facility] (established care 08/18/2020) Electrophysiologist:  None  Click to update primary MD,subspecialty MD or APP then REFRESH:1}    Chief Complaint  Patient presents with   Follow-up    Chest pain    Shortness of Breath    History of Present Illness: .   Deborah Kennedy is a 62 y.o.  female whose past medical history and cardiovascular risk factors includes: Hypertension, family history of premature CAD, obesity due to excess calories.   Patient was referred to the practice back in 2021 for evaluation of chest pain and shortness of breath.  She has undergone appropriate ischemic workup in the past as outlined below.  With up titration of antihypertensive medications her shortness of breath with effort related activities have improved significantly.  She now presents for a 2-year follow-up visit.   Chest pain: Onset during the summer 2024. Concerning enough that she is gone to urgent care as well as ED for evaluation. She describes the discomfort as a pinprick like sensation over the substernal region. Intensity is 3 out of 10, occurs randomly. Not always brought on by effort related activities and does not resolve with rest. Self-limited.  With regular physical activity such as stationary bike for 45 minutes she has no symptoms of precordial pain or dyspnea on exertion.  However with overexertion her shortness of breath is quite profound.  She had gone to the emergency room department at [facility] in July 2024 for similar symptoms high sensitive troponins were negative x 2 and she was asked to follow-up with cardiology as outpatient.   Review of Systems: .   Review of Systems  Cardiovascular:  Positive for chest pain, dyspnea on exertion and  leg swelling (chronic and stable). Negative for claudication, irregular heartbeat, near-syncope, orthopnea, palpitations, paroxysmal nocturnal dyspnea and syncope.  Hematologic/Lymphatic: Negative for bleeding problem.  Musculoskeletal:  Negative for muscle cramps and myalgias.  Neurological:  Negative for dizziness and light-headedness.    Studies Reviewed:   EKG: EKG Interpretation Date/Time:  Monday August 19 2023 11:41:26 EDT Ventricular Rate:  59 PR Interval:  184 QRS Duration:  98 QT Interval:  408 QTC Calculation: 403 R Axis:   26  Text Interpretation: Sinus bradycardia When compared with ECG of 13-May-2023 19:12, No significant change was found Confirmed by Tessa Lerner [phone]) on 08/19/2023 12:00:27 PM  Echocardiogram: 08/25/2020:  Normal LV systolic function with visual EF 55-60%. Left ventricle cavity is normal in size. Normal global wall motion. Normal diastolic filling pattern, normal LAP.  Mild (Grade I) mitral regurgitation.  Mild tricuspid regurgitation. No evidence of pulmonary hypertension.  No prior study for comparison.   Stress Testing: Exercise Sestamibi Stress Test  08/29/2020: Normal ECG stress. The patient exercised for 9 minutes and 14 seconds of a Bruce protocol, however, protocol deviation due to dyspnea, and grade reduced achieving approximately 7.69 METs. Baseline heart rate was 59 bpm. A maximum heart rate of 133 beats per minute was achieved, which is 81% of the maximum predicted heart rate response.  Myocardial perfusion is normal. Overall LV systolic function is normal without regional wall motion abnormalities. Stress LV EF: 78%.  No previous exam available for comparison. Low risk study. Clinical correlation recommended.   Heart Catheterization: Lexiscan Tetrofosmin Stress Test  08/29/2020:  Nondiagnostic ECG stress. Lexiscan stress.  Myocardial perfusion is normal.  Overall LV systolic function is normal without regional wall motion  abnormalities.  Stress LV EF: 65%.  No previous exam available for comparison. Low risk study.   RADIOLOGY: Chest x-ray July 2024: No acute cardiopulmonary process  Risk Assessment/Calculations:   N/A   Labs:       Latest Ref Rng & Units 05/13/2023    7:41 PM 12/07/2018   12:26 AM 12/03/2018    5:41 AM  CBC  WBC 4.0 - 10.5 K/uL 4.0  5.8  7.5   Hemoglobin 12.0 - 15.0 g/dL 16.1  9.9  09.6   Hematocrit 36.0 - 46.0 % 38.4  31.2  35.8   Platelets 150 - 400 K/uL 187  201  171        Latest Ref Rng & Units 05/13/2023    7:41 PM 09/15/2020    1:11 PM 12/07/2018   12:26 AM  BMP  Glucose 70 - 99 mg/dL 045  409  811   BUN 6 - 20 mg/dL 13  15  16    Creatinine 0.44 - 1.00 mg/dL 9.14  7.82  9.56   BUN/Creat Ratio 9 - 23  17    Sodium 135 - 145 mmol/L 138  142  135   Potassium 3.5 - 5.1 mmol/L 3.7  4.1  3.8   Chloride 98 - 111 mmol/L 108  104  102   CO2 22 - 32 mmol/L 21  21  25    Calcium 8.9 - 10.3 mg/dL 9.1  9.3  8.7       Latest Ref Rng & Units 05/13/2023    7:41 PM 09/15/2020    1:11 PM 12/07/2018   12:26 AM  CMP  Glucose 70 - 99 mg/dL 213  086  578   BUN 6 - 20 mg/dL 13  15  16    Creatinine 0.44 - 1.00 mg/dL 4.69  6.29  5.28   Sodium 135 - 145 mmol/L 138  142  135   Potassium 3.5 - 5.1 mmol/L 3.7  4.1  3.8   Chloride 98 - 111 mmol/L 108  104  102   CO2 22 - 32 mmol/L 21  21  25    Calcium 8.9 - 10.3 mg/dL 9.1  9.3  8.7     Lab Results  Component Value Date   CHOL 128 10/31/2009   HDL 41 10/31/2009   LDLCALC 77 10/31/2009   TRIG 51 10/31/2009   CHOLHDL 3.1 Ratio 10/31/2009   No results for input(s): "LIPOA" in the last 8760 hours. No components found for: "NTPROBNP" No results for input(s): "PROBNP" in the last 8760 hours. No results for input(s): "TSH" in the last 8760 hours.  External Labs: Collected: May 27, 2023: Total cholesterol 103, triglycerides 97, HDL 43, LDL calculated 42  Physical Exam:    Today's Vitals   08/19/23 1135  BP: (!) 140/86  Pulse: (!)  59  Resp: 16  SpO2: 98%  Weight: 259 lb (117.5 kg)  Height: 5\' 6"  (1.676 m)   Body mass index is 41.8 kg/m. Wt Readings from Last 3 Encounters:  08/19/23 259 lb (117.5 kg)  05/13/23 229 lb 4.5 oz (104 kg)  06/08/21 234 lb 3.2 oz (106.2 kg)    Physical Exam  Constitutional: No distress.  hemodynamically stable  Neck: No JVD present.  Cardiovascular: Normal rate, regular rhythm, S1 normal, S2 normal, intact distal pulses and normal pulses. Exam reveals no gallop, no S3  and no S4.  No murmur heard. Pulses:      Dorsalis pedis pulses are 2+ on the right side and 2+ on the left side.       Posterior tibial pulses are 2+ on the right side and 2+ on the left side.  Pulmonary/Chest: Effort normal and breath sounds normal. No stridor. She has no wheezes. She has no rales.  Abdominal: Soft. Bowel sounds are normal. She exhibits no distension. There is no abdominal tenderness.  Musculoskeletal:        General: No edema.     Cervical back: Neck supple.  Neurological: She is alert and oriented to person, place, and time. She has intact cranial nerves (2-12).  Skin: Skin is warm and moist.     Impression & Recommendation(s):  Impression:   ICD-10-CM   1. Precordial pain  R07.2 CT CORONARY MORPH W/CTA COR W/SCORE W/CA W/CM &/OR WO/CM    Basic metabolic panel    2. Dyspnea on exertion  R06.09 Basic metabolic panel    3. Benign hypertension  I10 EKG 12-Lead    Basic metabolic panel    4. Family history of premature CAD  Z82.49 Basic metabolic panel    5. Class 3 severe obesity due to excess calories without serious comorbidity with body mass index (BMI) of 40.0 to 44.9 in adult [facility])  E95.284 Basic metabolic panel   X32.44    Z68.41        Recommendation(s):  Precordial pain Dyspnea on exertion Precordial discomfort likely noncardiac. EKG today is nonischemic. No symptoms with regular baseline activity but with overexertion her dyspnea is quite pronounced. Prior echo and  stress test results reviewed. Shared decision was to proceed with coronary CTA for evaluation of obstructive disease and CAC. Her heart rate is well-controlled on Bystolic. Will give her a 25 mg Lopressor tablet x 1 two hours prior to her study to optimize her ventricular rate. BMP prior to the coronary CTA. Patient will also need contrast prophylaxis given her allergies.  Benign hypertension Office blood pressures are not well-controlled. She does not check her blood pressures on a regular basis at home. Re emphasize importance of low-salt diet. Reemphasized importance of monitoring her blood pressures at home. She will reach out to PCP or myself for further medication titration. Recommended goal SBP of 130 mmHg.  Class 3 severe obesity due to excess calories without serious comorbidity with body mass index (BMI) of 40.0 to 44.9 in adult [facility]) Body mass index is 41.8 kg/m. Patient has been given a prescription for [facility] but has not started it yet-sees PCP I reviewed with her importance of diet, regular physical activity/exercise, weight loss.   Patient is educated on the importance of increasing physical activity gradually as tolerated with a goal of moderate intensity exercise for 30 minutes a day 5 days a week.  Orders Placed:  Orders Placed This Encounter  Procedures   CT CORONARY MORPH W/CTA COR W/SCORE W/CA W/CM &/OR WO/CM    Standing Status:   Future    Standing Expiration Date:   08/18/2024    Order Specific Question:   If indicated for the ordered procedure, I authorize the administration of contrast media per Radiology protocol    Answer:   Yes    Order Specific Question:   Initiate Coronary CTA Adult Protocol    Answer:   Yes    Order Specific Question:   If indicated initiate Post Coronary CTA Hypotension Adult Protocol    Answer:  Yes    Order Specific Question:   Does the patient have a contrast media/X-ray dye allergy?    Answer:   No    Order Specific Question:    Is patient pregnant?    Answer:   No    Order Specific Question:   Authorization:    Answer:   FFR will be ordered if deemed medically necessary   Basic metabolic panel    Order Specific Question:   Has the patient fasted?    Answer:   No   EKG 12-Lead    As part of medical decision making outside labs from [facility] database, EKG, ER records from July 2024 for independently reviewed as part of medical decision making during today's encounter.  Final Medication List:    Meds ordered this encounter  Medications   predniSONE (DELTASONE) 50 MG tablet    Sig: Take one tablet 13 hours, 7 hours, and 1 hour prior to scan.    Dispense:  3 tablet    Refill:  0   diphenhydrAMINE (BENADRYL) 50 MG capsule    Sig: Take one capsule 1 hour prior to scan.    Dispense:  1 capsule    Refill:  0   metoprolol tartrate (LOPRESSOR) 25 MG tablet    Sig: Take 1 tablet (25 mg total) by mouth once for 1 dose. Take 90-120 minutes prior to scan. Hold for SBP less than 110.    Dispense:  1 tablet    Refill:  0    Medications Discontinued During This Encounter  Medication Reason   ciprofloxacin (CIPRO) 250 MG tablet    conjugated estrogens (PREMARIN) vaginal cream    estradiol (ESTRACE) 0.1 MG/GM vaginal cream    BYSTOLIC 5 MG tablet Change in therapy   pantoprazole (PROTONIX) 40 MG tablet      Current Outpatient Medications:    acetaminophen (TYLENOL) 500 MG tablet, Take 500 mg by mouth every 6 (six) hours as needed., Disp: , Rfl:    diphenhydrAMINE (BENADRYL) 50 MG capsule, Take one capsule 1 hour prior to scan., Disp: 1 capsule, Rfl: 0   ibuprofen (ADVIL) 200 MG tablet, Take 200 mg by mouth every 6 (six) hours as needed., Disp: , Rfl:    losartan (COZAAR) 50 MG tablet, Take 1 tablet (50 mg total) by mouth at bedtime., Disp: 90 tablet, Rfl: 1   losartan (COZAAR) 50 MG tablet, Take 1 tablet (50 mg total) by mouth at bedtime., Disp: 90 tablet, Rfl: 1   metoprolol tartrate (LOPRESSOR) 25 MG tablet, Take 1  tablet (25 mg total) by mouth once for 1 dose. Take 90-120 minutes prior to scan. Hold for SBP less than 110., Disp: 1 tablet, Rfl: 0   nebivolol (BYSTOLIC) 10 MG tablet, Take 1 tablet (10 mg total) by mouth daily., Disp: 60 tablet, Rfl: 4   nebivolol (BYSTOLIC) 10 MG tablet, Take 1 tablet (10 mg total) by mouth daily., Disp: 90 tablet, Rfl: 1   predniSONE (DELTASONE) 50 MG tablet, Take one tablet 13 hours, 7 hours, and 1 hour prior to scan., Disp: 3 tablet, Rfl: 0   atorvastatin (LIPITOR) 20 MG tablet, Take 1 tablet (20 mg total) by mouth at bedtime for cholesterol. (Patient not taking: Reported on 08/19/2023), Disp: 90 tablet, Rfl: 0   atorvastatin (LIPITOR) 20 MG tablet, Take 1 tablet (20 mg total) by mouth at bedtime for cholesterol (Patient not taking: Reported on 08/19/2023), Disp: 90 tablet, Rfl: 0   atorvastatin (LIPITOR) 20 MG tablet, Take  1 tablet (20 mg total) by mouth at bedtime for cholesterol (Patient not taking: Reported on 08/19/2023), Disp: 90 tablet, Rfl: 0   mupirocin ointment (BACTROBAN) 2 %, Apply 1 application topically 3 (three) times daily as needed for impetigo, Disp: 22 g, Rfl: 0   Semaglutide-Weight Management 0.25 MG/0.5ML SOAJ, Inject 0.25 mg into the skin once a week. (Patient not taking: Reported on 08/19/2023), Disp: 2 mL, Rfl: 0   Semaglutide-Weight Management 0.25 MG/0.5ML SOAJ, Inject 0.25 mg into the skin once a week, 30 minutes before 1st meal of the day (Patient not taking: Reported on 08/19/2023), Disp: 2 mL, Rfl: 0   Semaglutide-Weight Management 0.25 MG/0.5ML SOAJ, Inject 0.25 mg into the skin once a week, 30 minutes before first meal of the day (Patient not taking: Reported on 08/19/2023), Disp: 2 mL, Rfl: 0   Semaglutide-Weight Management 0.25 MG/0.5ML SOAJ, Inject 0.25 mg into the skin once a week. (Patient not taking: Reported on 08/19/2023), Disp: 2 mL, Rfl: 0  Consent:   NA  Disposition:   Would like to see her back in 1 year or sooner based on the  results of the coronary CTA.  Her questions and concerns were addressed to her satisfaction. She voices understanding of the recommendations provided during this encounter.    Signed, Tessa Lerner, DO, [facility]  [facility]  [address] [place], [place] [address] 08/19/2023 12:30 PM

## 2023-08-20 ENCOUNTER — Other Ambulatory Visit (HOSPITAL_COMMUNITY): Payer: Self-pay

## 2023-08-28 ENCOUNTER — Ambulatory Visit (INDEPENDENT_AMBULATORY_CARE_PROVIDER_SITE_OTHER): Payer: 59 | Admitting: Otolaryngology

## 2023-08-28 ENCOUNTER — Encounter (INDEPENDENT_AMBULATORY_CARE_PROVIDER_SITE_OTHER): Payer: Self-pay

## 2023-08-28 ENCOUNTER — Ambulatory Visit (INDEPENDENT_AMBULATORY_CARE_PROVIDER_SITE_OTHER): Payer: 59 | Admitting: Audiology

## 2023-08-28 VITALS — Ht 64.0 in | Wt 235.0 lb

## 2023-08-28 DIAGNOSIS — H903 Sensorineural hearing loss, bilateral: Secondary | ICD-10-CM

## 2023-08-28 DIAGNOSIS — H6982 Other specified disorders of Eustachian tube, left ear: Secondary | ICD-10-CM

## 2023-08-28 NOTE — Progress Notes (Signed)
  [address] [place], [place] [address] [phone]  Audiological Evaluation    Name: Deborah Kennedy     DOB:   11-Apr-1961      MRN:   [id number]                                                                                     Service Date: 08/28/2023        Patient was referred today for a hearing evaluation by Dr. Karle Barr.   Symptoms Yes Details  Hearing loss  [x]  Patient reported perceiving hearing loss where the left ear is worse than the right ear.  Tinnitus  []  Patient denied experiencing tinnitus.  Balance problems  [x]  Patient reported vertigo sensations.  Previous ear surgeries  []  Patient denied any previous ear surgeries.  Family history  []  Patient denied family history of hearing loss.  Amplification  []  Patient denied the use of hearing aids.    Tympanogram: Right ear: Could not obtain seal; middle ear status is unable to be determined at this time. Left ear: Normal external ear canal volume with normal middle ear pressure and tympanic membrane compliance (Type A).    Hearing Evaluation: The audiogram was completed using conventional audiometric techniques under headphones with good reliability.   The hearing test results indicate: Right ear: Normal hearing sensitivity from 125-250 Hz sloping to mild sensorineural hearing loss from [phone] Hz rising to normal hearing sensitivity from 2000-8000 Hz. Left ear: Normal hearing sensitivity from 125-250 Hz sloping to mild sensorineural hearing loss from [phone] Hz rising to normal hearing sensitivity from 2000-8000 Hz.  Speech Audiometry: Right ear- Speech Reception Threshold (SRT) was obtained at 20 dBHL. Left ear- Speech Reception Threshold (SRT) was obtained at 20 dBHL.   Word Recognition Score Tested using NU-6 (MLV) Right ear: 100% was obtained at a presentation level of 60 dBHL which is deemed as excellent understanding. Left ear: 100% was obtained at a presentation level of 60  dBHL which is deemed as excellent understanding.   Impression:  There is not a significant difference between puretone thresholds and word recognition scores between ears.   Recommendations: Repeat audiogram when changes are perceived or per MD.   Conley Rolls Gisela Lea, AUD, CCC-A 08/28/23

## 2023-08-31 DIAGNOSIS — H6982 Other specified disorders of Eustachian tube, left ear: Secondary | ICD-10-CM | POA: Insufficient documentation

## 2023-08-31 DIAGNOSIS — H903 Sensorineural hearing loss, bilateral: Secondary | ICD-10-CM | POA: Insufficient documentation

## 2023-08-31 NOTE — Progress Notes (Signed)
Patient ID: Deborah Kennedy, female   DOB: 31-May-1961, 62 y.o.   MRN: [id number]  CC: Clogging sensation in left ear, left ear hearing loss  HPI: The patient is a 62 year old female who presents today complaining of clogging sensation in her left ear since July.  Her symptoms started after a flight in July.  The hearing loss has gradually improved.  However, she continues to have a constant clogging sensation in her left ear.  She denies any significant otalgia, otorrhea, or vertigo.  She is able to auto insufflate her middle ear spaces with the Valsalva exercise.  The patient has no previous otologic surgery.  Exam: General: Communicates without difficulty, well nourished, no acute distress. Head: Normocephalic, no evidence injury, no tenderness, facial buttresses intact without stepoff. Face/sinus: No tenderness to palpation and percussion. Facial movement is normal and symmetric. Eyes: PERRL, EOMI. No scleral icterus, conjunctivae clear. Neuro: CN II exam reveals vision grossly intact.  No nystagmus at any point of gaze. Ears: Auricles well formed without lesions.  Ear canals are intact without mass or lesion.  No erythema or edema is appreciated.  The TMs are intact without fluid. Nose: External evaluation reveals normal support and skin without lesions.  Dorsum is intact.  Anterior rhinoscopy reveals congested mucosa over anterior aspect of inferior turbinates and intact septum.  No purulence noted. Oral:  Oral cavity and oropharynx are intact, symmetric, without erythema or edema.  Mucosa is moist without lesions. Neck: Full range of motion without pain.  There is no significant lymphadenopathy.  No masses palpable.  Thyroid bed within normal limits to palpation.  Parotid glands and submandibular glands equal bilaterally without mass.  Trachea is midline. Neuro:  CN 2-12 grossly intact.    Her hearing test shows bilateral mild low-frequency sensorineural hearing loss.  Assessment: 1.  The  patient's ear canals, tympanic membranes, and middle ear spaces are all normal. 2.  Her history is suggestive of left ear eustachian tube dysfunction. 3.  Bilateral mild low-frequency sensorineural hearing loss.  No significant asymmetry is noted today.  Plan: 1.  The physical exam findings and the hearing test results are reviewed with the patient. 2.  The patient is reassured that no asymmetric left ear hearing loss is noted. 3.  Flonase nasal spray as needed to treat the eustachian tube dysfunction. 4.  Valsalva exercise multiple times a day. 5.  The patient is encouraged to call with any questions or concerns.

## 2023-09-09 ENCOUNTER — Telehealth (HOSPITAL_COMMUNITY): Payer: Self-pay | Admitting: *Deleted

## 2023-09-09 NOTE — Telephone Encounter (Signed)
Reaching out to patient to offer assistance regarding upcoming cardiac imaging study; pt verbalizes understanding of appt date/time, parking situation and where to check in, pre-test NPO status and medications ordered, and verified current allergies; name and call back number provided for further questions should they arise  Larey Brick RN Navigator Cardiac Imaging [facility] [phone] office [phone] cell  Patient is unsure if she has ever had a CT scan with contrast and unsure if she is allergic to IV contrast. She states that she prefers to take 13 hour prep because it was prescribed to her. We reviewed how to take the medication. She is aware to take medication at 12:30 AM, 6:30 AM, 11:30 AM, and 12:30 PM.   She is aware to arrive at 1 PM but will call back if she cannot get a ride.

## 2023-09-10 ENCOUNTER — Ambulatory Visit (HOSPITAL_COMMUNITY)
Admission: RE | Admit: 2023-09-10 | Discharge: 2023-09-10 | Disposition: A | Discharge status: Home / Self Care | Payer: 59 | Source: Ambulatory Visit | Attending: Cardiology | Admitting: Cardiology

## 2023-09-10 DIAGNOSIS — I2510 Atherosclerotic heart disease of native coronary artery without angina pectoris: Secondary | ICD-10-CM | POA: Insufficient documentation

## 2023-09-10 DIAGNOSIS — R072 Precordial pain: Secondary | ICD-10-CM | POA: Diagnosis present

## 2023-09-10 DIAGNOSIS — K449 Diaphragmatic hernia without obstruction or gangrene: Secondary | ICD-10-CM | POA: Diagnosis not present

## 2023-09-10 MED ORDER — NITROGLYCERIN 0.4 MG SL SUBL
0.8000 mg | SUBLINGUAL_TABLET | Freq: Once | SUBLINGUAL | Status: AC
  Administered 2023-09-10: 0.8 mg via SUBLINGUAL

## 2023-09-10 MED ORDER — DIPHENHYDRAMINE HCL 25 MG PO CAPS
ORAL_CAPSULE | ORAL | Status: AC
  Filled 2023-09-10: qty 2

## 2023-09-10 MED ORDER — DIPHENHYDRAMINE HCL 25 MG PO CAPS
25.0000 mg | ORAL_CAPSULE | Freq: Four times a day (QID) | ORAL | Status: DC | PRN
  Administered 2023-09-10: 25 mg via ORAL

## 2023-09-10 MED ORDER — IOPAMIDOL (ISOVUE-370) INJECTION 76%
95.0000 mL | Freq: Once | INTRAVENOUS | Status: AC | PRN
  Administered 2023-09-10: 95 mL via INTRAVENOUS

## 2023-09-10 MED ORDER — NITROGLYCERIN 0.4 MG SL SUBL
SUBLINGUAL_TABLET | SUBLINGUAL | Status: AC
  Filled 2023-09-10: qty 2

## 2023-09-10 MED ORDER — DIPHENHYDRAMINE HCL 25 MG PO CAPS
25.0000 mg | ORAL_CAPSULE | Freq: Once | ORAL | Status: AC
  Administered 2023-09-10: 25 mg via ORAL

## 2023-09-13 ENCOUNTER — Other Ambulatory Visit (HOSPITAL_COMMUNITY): Payer: Self-pay

## 2023-09-13 MED ORDER — SEMAGLUTIDE-WEIGHT MANAGEMENT 0.25 MG/0.5ML ~~LOC~~ SOAJ
0.2500 mg | SUBCUTANEOUS | 0 refills | Status: AC
  Filled 2023-09-13: qty 2, 28d supply, fill #0

## 2023-09-13 MED ORDER — SEMAGLUTIDE-WEIGHT MANAGEMENT 1 MG/0.5ML ~~LOC~~ SOAJ: 1.0000 mg | SUBCUTANEOUS | 1 refills | Status: AC

## 2023-09-16 ENCOUNTER — Other Ambulatory Visit: Payer: Self-pay

## 2023-09-17 ENCOUNTER — Other Ambulatory Visit (HOSPITAL_COMMUNITY): Payer: Self-pay

## 2023-10-08 ENCOUNTER — Other Ambulatory Visit (HOSPITAL_COMMUNITY): Payer: Self-pay

## 2023-10-08 MED ORDER — WEGOVY 0.5 MG/0.5ML ~~LOC~~ SOAJ
0.5000 mg | SUBCUTANEOUS | 0 refills | Status: AC
Start: 2023-10-08 — End: ?
  Filled 2023-10-08 – 2023-10-09 (×2): qty 2, 28d supply, fill #0

## 2023-10-09 ENCOUNTER — Other Ambulatory Visit (HOSPITAL_COMMUNITY): Payer: Self-pay

## 2023-10-09 ENCOUNTER — Other Ambulatory Visit: Payer: Self-pay

## 2023-10-22 ENCOUNTER — Other Ambulatory Visit (HOSPITAL_COMMUNITY): Payer: Self-pay

## 2023-11-05 ENCOUNTER — Ambulatory Visit (INDEPENDENT_AMBULATORY_CARE_PROVIDER_SITE_OTHER): Payer: 59 | Admitting: Orthopedic Surgery

## 2023-11-05 ENCOUNTER — Encounter: Payer: Self-pay | Admitting: Orthopedic Surgery

## 2023-11-05 ENCOUNTER — Other Ambulatory Visit (HOSPITAL_COMMUNITY): Payer: Self-pay

## 2023-11-05 ENCOUNTER — Other Ambulatory Visit (INDEPENDENT_AMBULATORY_CARE_PROVIDER_SITE_OTHER): Payer: Self-pay

## 2023-11-05 ENCOUNTER — Telehealth: Payer: Self-pay | Admitting: Cardiology

## 2023-11-05 DIAGNOSIS — Z96652 Presence of left artificial knee joint: Secondary | ICD-10-CM

## 2023-11-05 DIAGNOSIS — G8929 Other chronic pain: Secondary | ICD-10-CM

## 2023-11-05 DIAGNOSIS — M25562 Pain in left knee: Secondary | ICD-10-CM | POA: Diagnosis not present

## 2023-11-05 MED ORDER — PANTOPRAZOLE SODIUM 40 MG PO TBEC
40.0000 mg | DELAYED_RELEASE_TABLET | Freq: Every day | ORAL | 0 refills | Status: AC
  Filled 2023-11-05: qty 90, 90d supply, fill #0

## 2023-11-05 MED ORDER — WEGOVY 1.7 MG/0.75ML ~~LOC~~ SOAJ
1.7000 mg | SUBCUTANEOUS | 0 refills | Status: DC
Start: 2023-11-05 — End: 2023-12-04
  Filled 2023-11-05: qty 3, 28d supply, fill #0

## 2023-11-05 NOTE — Patient Instructions (Signed)
 Ice and medications as needed  Use walker to assist with ambulation  Knee brace, locked in extension to prevent knee buckling  Follow up with Dr. Despina Hick as soon as possible.

## 2023-11-05 NOTE — Telephone Encounter (Signed)
 Pt called in stating her PCP office next received Ct from 09/10/23 and she asked that it be sent to them again.

## 2023-11-05 NOTE — Telephone Encounter (Signed)
 Called pt and explained that our office would re-send a copy of the CT scan to her PCP. Verified PCP and office location with pt. Explained to pt that we would call their PCP office and ask them to keep an eye out for this copy of the scan and to please contact pt when received and reviewed.

## 2023-11-06 ENCOUNTER — Other Ambulatory Visit (HOSPITAL_COMMUNITY): Payer: Self-pay

## 2023-11-06 ENCOUNTER — Encounter: Payer: Self-pay | Admitting: Orthopedic Surgery

## 2023-11-06 DIAGNOSIS — Z5189 Encounter for other specified aftercare: Secondary | ICD-10-CM | POA: Diagnosis not present

## 2023-11-06 DIAGNOSIS — M7652 Patellar tendinitis, left knee: Secondary | ICD-10-CM | POA: Diagnosis not present

## 2023-11-06 DIAGNOSIS — Z96652 Presence of left artificial knee joint: Secondary | ICD-10-CM | POA: Diagnosis not present

## 2023-11-06 NOTE — Progress Notes (Signed)
 New Patient Visit  Assessment: Deborah Kennedy is a 63 y.o. female with the following: 1. Acute pain of left knee; h/o L TKA 12/01/18 Dr. Melodi   Plan: [name] has acute onset of pain in the left knee.  She notes buckling sensations.  Radiographs obtained in clinic today are reassuring.  No obvious subsidence of the implants.  No acute fractures.  She has been able to secure an appointment with Dr. Melodi.  In the meantime, I have recommended medication as needed, ice, protected weightbearing in the brace that she can lock in extension.  If she has any further issues, I am happy to see her in clinic at any time.  Follow-up: Return if symptoms worsen or fail to improve.  Subjective:  Chief Complaint  Patient presents with   Knee Pain    Left knee pain, History of TKR 01/20    History of Present Illness: Deborah Kennedy is a 63 y.o. female who presents for evaluation of left knee pain.  She is a history of left total knee arthroplasty completed by Dr. Melodi in February 2020.  Uneventful surgery.  She recovered well.  She was in her usual state of health until earlier today.  She reports that she got out of her car, and noted acute onset of pain in the medial aspect of the left knee.  The knee subsequently buckled.  She attempted to walk again, but continued to have severe pain.  She did buckle and go to the ground.  She needed assistance to get up.  She very carefully got her self back into her car, and presented to clinic for evaluation.  This is not happened before.  No other injuries.  Pain is anterior and anterior medial.   Review of Systems: No fevers or chills No numbness or tingling No chest pain No shortness of breath No bowel or bladder dysfunction No GI distress No headaches   Medical History:  Past Medical History:  Diagnosis Date   Acute medial meniscus tear, right, subsequent encounter    Anxiety    Complication of anesthesia     reaction to epidural medication   Depression    Hypertension     Past Surgical History:  Procedure Laterality Date   ABDOMINAL HYSTERECTOMY  10/18/2011   Procedure: HYSTERECTOMY ABDOMINAL;  Surgeon: Norleen LULLA Server, MD;  Location: [facility];  Service: Gynecology;  Laterality: N/A;   CERVICAL FUSION  2008   KNEE ARTHROSCOPY WITH MEDIAL MENISECTOMY Right 06/14/2017   Procedure: RIGHT KNEE ARTHROSCOPY WITH MEDIAL MENISECTOMY;  Surgeon: Jane Charleston, MD;  Location: [facility];  Service: Orthopedics;  Laterality: Right;   TOTAL KNEE ARTHROPLASTY Left 12/01/2018   Procedure: TOTAL KNEE ARTHROPLASTY;  Surgeon: [name], MD;  Location: [facility];  Service: Orthopedics;  Laterality: Left;    TUBAL LIGATION      Family History  Problem Relation Age of Onset   Cancer Father    Hypertension Mother    Heart Problems Sister    CAD Maternal Grandmother    Heart failure Maternal Grandmother    Diabetes Maternal Grandmother    Heart attack Maternal Grandfather    CAD Sister    Social History   Tobacco Use   Smoking status: Never   Smokeless tobacco: Never  Vaping Use   Vaping status: Never Used  Substance Use Topics   Alcohol use: No   Drug use: No    Allergies  Allergen Reactions  Shellfish Allergy Itching   Erythromycin Other (See Comments)    Unknown   Other Itching and Other (See Comments)    Pt states that she had a reaction to her epidural during labor in 1996. Severe itching.    Current Meds  Medication Sig   acetaminophen  (TYLENOL ) 500 MG tablet Take 500 mg by mouth every 6 (six) hours as needed.   atorvastatin  (LIPITOR) 20 MG tablet Take 1 tablet (20 mg total) by mouth at bedtime for cholesterol.   atorvastatin  (LIPITOR) 20 MG tablet Take 1 tablet (20 mg total) by mouth at bedtime for cholesterol   atorvastatin  (LIPITOR) 20 MG tablet Take 1 tablet (20 mg total) by mouth at bedtime for cholesterol   diphenhydrAMINE  (BENADRYL ) 50 MG capsule Take one  capsule 1 hour prior to scan.   ibuprofen (ADVIL) 200 MG tablet Take 200 mg by mouth every 6 (six) hours as needed.   losartan  (COZAAR ) 50 MG tablet Take 1 tablet (50 mg total) by mouth at bedtime.   losartan  (COZAAR ) 50 MG tablet Take 1 tablet (50 mg total) by mouth at bedtime.   mupirocin  ointment (BACTROBAN ) 2 % Apply 1 application topically 3 (three) times daily as needed for impetigo   nebivolol  (BYSTOLIC ) 10 MG tablet Take 1 tablet (10 mg total) by mouth daily.   nebivolol  (BYSTOLIC ) 10 MG tablet Take 1 tablet (10 mg total) by mouth daily.   pantoprazole  (PROTONIX ) 40 MG tablet Take 1 tablet (40 mg total) by mouth daily for stomach   predniSONE  (DELTASONE ) 50 MG tablet Take one tablet 13 hours, 7 hours, and 1 hour prior to scan.   Semaglutide -Weight Management (WEGOVY ) 0.5 MG/0.5ML SOAJ Inject 0.5 mg into the skin once a week, 30 minutes before the 1st meal of that day   Semaglutide -Weight Management 0.25 MG/0.5ML SOAJ Inject 0.25 mg into the skin once a week.   Semaglutide -Weight Management 0.25 MG/0.5ML SOAJ Inject 0.25 mg into the skin once a week, 30 minutes before 1st meal of the day   Semaglutide -Weight Management 0.25 MG/0.5ML SOAJ Inject 0.25 mg into the skin once a week, 30 minutes before first meal of the day   Semaglutide -Weight Management 0.25 MG/0.5ML SOAJ Inject 0.25 mg into the skin once a week.   Semaglutide -Weight Management 0.25 MG/0.5ML SOAJ Inject 0.25 mg into the skin once a week, 30 minutes before 1st meal of the day   Semaglutide -Weight Management 0.25 MG/0.5ML SOAJ Inject 0.25 mg into the skin once a week, 30 minutes before 1st meal of that day.   Semaglutide -Weight Management 1 MG/0.5ML SOAJ Inject 1 mg into the skin once a week, before 1st meal of that day.   WEGOVY  1.7 MG/0.75ML SOAJ Inject 1.7 mg into the skin once a week 30 minutes before the first meal of the day    Objective: LMP 09/19/2011   Physical Exam:  General: Alert and oriented. and No acute  distress. Gait: Unable to ambulate.  Evaluation left knee demonstrates a well-healed anterior based surgical incision.  No drainage.  No surrounding erythema or drainage.  No tenderness in line with the incision.  She does have tenderness in the area of the tibial tubercle.  The patellar tendon is intact.  She is able to achieve close to full active extension of the left knee.  She has some pain over the medial knee, in the area of the pes tendons.  In addition, she has pain with valgus stress, without noted laxity.  Sensation is intact distally.  IMAGING:  I personally ordered and reviewed the following images   X-rays of the left knee were obtained in clinic today.  No acute injuries are noted.  No fractures.  Well-positioned total knee arthroplasty without subsidence.  No periprosthetic lucency.  Extensor mechanism appears to be intact based on the shadowing.  No overall change in alignment or position of the implants.  Impression: Left knee x-ray with well-positioned total knee arthroplasty, without acute findings   New Medications:  No orders of the defined types were placed in this encounter.     Oneil DELENA Horde, MD  11/06/2023 9:22 AM

## 2023-11-08 ENCOUNTER — Other Ambulatory Visit (HOSPITAL_COMMUNITY): Payer: Self-pay

## 2023-11-08 MED ORDER — LOSARTAN POTASSIUM 50 MG PO TABS
50.0000 mg | ORAL_TABLET | Freq: Every evening | ORAL | 1 refills | Status: DC
  Filled 2023-11-08: qty 90, 90d supply, fill #0
  Filled 2024-03-02: qty 90, 90d supply, fill #1

## 2023-11-15 ENCOUNTER — Other Ambulatory Visit (HOSPITAL_COMMUNITY): Payer: Self-pay

## 2023-12-04 ENCOUNTER — Other Ambulatory Visit (HOSPITAL_COMMUNITY): Payer: Self-pay

## 2023-12-04 MED ORDER — WEGOVY 1.7 MG/0.75ML ~~LOC~~ SOAJ
1.7000 mg | SUBCUTANEOUS | 0 refills | Status: AC
  Filled 2023-12-04: qty 3, 28d supply, fill #0

## 2023-12-04 MED ORDER — NEBIVOLOL HCL 10 MG PO TABS
10.0000 mg | ORAL_TABLET | Freq: Every day | ORAL | 1 refills | Status: AC
  Filled 2023-12-04: qty 90, 90d supply, fill #0
  Filled 2024-03-02: qty 90, 90d supply, fill #1

## 2023-12-04 MED ORDER — ATORVASTATIN CALCIUM 20 MG PO TABS
20.0000 mg | ORAL_TABLET | Freq: Every day | ORAL | 0 refills | Status: AC
  Filled 2023-12-04: qty 90, 90d supply, fill #0

## 2023-12-05 ENCOUNTER — Other Ambulatory Visit (HOSPITAL_COMMUNITY): Payer: Self-pay

## 2023-12-26 ENCOUNTER — Other Ambulatory Visit: Payer: Self-pay

## 2023-12-26 ENCOUNTER — Other Ambulatory Visit (HOSPITAL_COMMUNITY): Payer: Self-pay

## 2023-12-26 MED ORDER — WEGOVY 2.4 MG/0.75ML ~~LOC~~ SOAJ
2.4000 mg | SUBCUTANEOUS | 0 refills | Status: DC
Start: 2023-12-26 — End: 2024-01-28
  Filled 2023-12-26: qty 3, 28d supply, fill #0

## 2024-01-28 ENCOUNTER — Other Ambulatory Visit (HOSPITAL_COMMUNITY): Payer: Self-pay

## 2024-01-28 MED ORDER — WEGOVY 2.4 MG/0.75ML ~~LOC~~ SOAJ
2.4000 mg | SUBCUTANEOUS | 0 refills | Status: DC
Start: 2024-01-28 — End: 2024-03-11
  Filled 2024-01-28: qty 3, 28d supply, fill #0

## 2024-01-31 ENCOUNTER — Other Ambulatory Visit (HOSPITAL_COMMUNITY): Payer: Self-pay

## 2024-03-10 DIAGNOSIS — E7800 Pure hypercholesterolemia, unspecified: Secondary | ICD-10-CM | POA: Diagnosis not present

## 2024-03-10 DIAGNOSIS — Z6841 Body Mass Index (BMI) 40.0 and over, adult: Secondary | ICD-10-CM | POA: Diagnosis not present

## 2024-03-10 DIAGNOSIS — I10 Essential (primary) hypertension: Secondary | ICD-10-CM | POA: Diagnosis not present

## 2024-03-10 DIAGNOSIS — M1711 Unilateral primary osteoarthritis, right knee: Secondary | ICD-10-CM | POA: Diagnosis not present

## 2024-03-11 ENCOUNTER — Other Ambulatory Visit (HOSPITAL_COMMUNITY): Payer: Self-pay

## 2024-03-11 MED ORDER — WEGOVY 2.4 MG/0.75ML ~~LOC~~ SOAJ
2.4000 mg | SUBCUTANEOUS | 0 refills | Status: DC
Start: 2024-03-11 — End: 2024-04-22
  Filled 2024-03-11 – 2024-03-20 (×2): qty 3, 28d supply, fill #0

## 2024-03-20 ENCOUNTER — Other Ambulatory Visit (HOSPITAL_COMMUNITY): Payer: Self-pay

## 2024-04-22 ENCOUNTER — Other Ambulatory Visit (HOSPITAL_COMMUNITY): Payer: Self-pay

## 2024-04-22 MED ORDER — WEGOVY 2.4 MG/0.75ML ~~LOC~~ SOAJ
2.4000 mg | SUBCUTANEOUS | 0 refills | Status: DC
  Filled 2024-04-22: qty 3, 28d supply, fill #0

## 2024-04-23 DIAGNOSIS — Z1211 Encounter for screening for malignant neoplasm of colon: Secondary | ICD-10-CM | POA: Diagnosis not present

## 2024-04-23 DIAGNOSIS — Z860100 Personal history of colon polyps, unspecified: Secondary | ICD-10-CM | POA: Diagnosis not present

## 2024-04-23 DIAGNOSIS — K219 Gastro-esophageal reflux disease without esophagitis: Secondary | ICD-10-CM | POA: Diagnosis not present

## 2024-05-13 DIAGNOSIS — Z1231 Encounter for screening mammogram for malignant neoplasm of breast: Secondary | ICD-10-CM | POA: Diagnosis not present

## 2024-05-13 DIAGNOSIS — Z1331 Encounter for screening for depression: Secondary | ICD-10-CM | POA: Diagnosis not present

## 2024-05-13 DIAGNOSIS — Z01419 Encounter for gynecological examination (general) (routine) without abnormal findings: Secondary | ICD-10-CM | POA: Diagnosis not present

## 2024-05-20 ENCOUNTER — Other Ambulatory Visit (HOSPITAL_COMMUNITY): Payer: Self-pay

## 2024-05-20 MED ORDER — GOLYTELY 236 G PO SOLR
4000.0000 mL | Freq: Once | ORAL | 0 refills | Status: AC
  Filled 2024-05-20: qty 4000, 1d supply, fill #0

## 2024-05-26 ENCOUNTER — Other Ambulatory Visit (HOSPITAL_COMMUNITY): Payer: Self-pay

## 2024-05-26 ENCOUNTER — Other Ambulatory Visit: Payer: Self-pay

## 2024-05-26 MED ORDER — NEBIVOLOL HCL 10 MG PO TABS
10.0000 mg | ORAL_TABLET | Freq: Every day | ORAL | 1 refills | Status: DC
  Filled 2024-05-26: qty 90, 90d supply, fill #0
  Filled 2024-08-19: qty 90, 90d supply, fill #1

## 2024-05-26 MED ORDER — LOSARTAN POTASSIUM 50 MG PO TABS
50.0000 mg | ORAL_TABLET | Freq: Every evening | ORAL | 1 refills | Status: DC
  Filled 2024-05-26: qty 90, 90d supply, fill #0
  Filled 2024-09-21 (×2): qty 90, 90d supply, fill #1

## 2024-06-02 ENCOUNTER — Other Ambulatory Visit (HOSPITAL_COMMUNITY): Payer: Self-pay

## 2024-06-03 DIAGNOSIS — Z1211 Encounter for screening for malignant neoplasm of colon: Secondary | ICD-10-CM | POA: Diagnosis not present

## 2024-06-03 DIAGNOSIS — Z09 Encounter for follow-up examination after completed treatment for conditions other than malignant neoplasm: Secondary | ICD-10-CM | POA: Diagnosis not present

## 2024-06-03 DIAGNOSIS — Z860100 Personal history of colon polyps, unspecified: Secondary | ICD-10-CM | POA: Diagnosis not present

## 2024-06-24 ENCOUNTER — Other Ambulatory Visit (HOSPITAL_COMMUNITY): Payer: Self-pay

## 2024-06-24 MED ORDER — WEGOVY 2.4 MG/0.75ML ~~LOC~~ SOAJ
2.4000 mg | SUBCUTANEOUS | 0 refills | Status: AC
  Filled 2024-06-24: qty 3, 28d supply, fill #0

## 2024-07-28 DIAGNOSIS — E6609 Other obesity due to excess calories: Secondary | ICD-10-CM | POA: Diagnosis not present

## 2024-07-28 DIAGNOSIS — M1712 Unilateral primary osteoarthritis, left knee: Secondary | ICD-10-CM | POA: Diagnosis not present

## 2024-07-28 DIAGNOSIS — I10 Essential (primary) hypertension: Secondary | ICD-10-CM | POA: Diagnosis not present

## 2024-07-28 DIAGNOSIS — E7800 Pure hypercholesterolemia, unspecified: Secondary | ICD-10-CM | POA: Diagnosis not present

## 2024-08-21 ENCOUNTER — Other Ambulatory Visit (HOSPITAL_COMMUNITY): Payer: Self-pay

## 2024-08-25 ENCOUNTER — Other Ambulatory Visit (HOSPITAL_COMMUNITY): Payer: Self-pay

## 2024-08-25 MED ORDER — MIRABEGRON ER 25 MG PO TB24
25.0000 mg | ORAL_TABLET | Freq: Every day | ORAL | 1 refills | Status: AC
  Filled 2024-08-25: qty 90, 90d supply, fill #0
  Filled 2024-11-04: qty 90, 90d supply, fill #1

## 2024-09-21 ENCOUNTER — Other Ambulatory Visit (HOSPITAL_COMMUNITY): Payer: Self-pay

## 2024-10-03 ENCOUNTER — Other Ambulatory Visit (HOSPITAL_COMMUNITY): Payer: Self-pay

## 2024-10-03 MED ORDER — WEGOVY 0.25 MG/0.5ML ~~LOC~~ SOAJ
SUBCUTANEOUS | 0 refills | Status: AC
  Filled 2024-10-03 – 2024-10-06 (×2): qty 2, 28d supply, fill #0

## 2024-10-05 ENCOUNTER — Other Ambulatory Visit (HOSPITAL_COMMUNITY): Payer: Self-pay

## 2024-10-06 ENCOUNTER — Other Ambulatory Visit (HOSPITAL_COMMUNITY): Payer: Self-pay

## 2024-11-03 ENCOUNTER — Other Ambulatory Visit (HOSPITAL_COMMUNITY): Payer: Self-pay

## 2024-11-03 MED ORDER — WEGOVY 1 MG/0.5ML ~~LOC~~ SOAJ
1.0000 mg | SUBCUTANEOUS | 0 refills | Status: AC
  Filled 2024-11-03 – 2024-11-04 (×2): qty 2, 28d supply, fill #0

## 2024-11-03 MED ORDER — LOSARTAN POTASSIUM 50 MG PO TABS
50.0000 mg | ORAL_TABLET | Freq: Every day | ORAL | 1 refills | Status: AC
  Filled 2024-11-03: qty 90, 90d supply, fill #0

## 2024-11-03 MED ORDER — NEBIVOLOL HCL 10 MG PO TABS
10.0000 mg | ORAL_TABLET | Freq: Every day | ORAL | 1 refills | Status: AC
  Filled 2024-11-03 – 2024-11-19 (×2): qty 90, 90d supply, fill #0

## 2024-11-04 ENCOUNTER — Other Ambulatory Visit (HOSPITAL_COMMUNITY): Payer: Self-pay

## 2024-11-19 ENCOUNTER — Other Ambulatory Visit (HOSPITAL_COMMUNITY): Payer: Self-pay

## 2024-12-03 ENCOUNTER — Other Ambulatory Visit: Payer: Self-pay

## 2024-12-03 ENCOUNTER — Other Ambulatory Visit (HOSPITAL_COMMUNITY): Payer: Self-pay

## 2024-12-03 MED ORDER — WEGOVY 1.7 MG/0.75ML ~~LOC~~ SOAJ
1.7000 mg | SUBCUTANEOUS | 0 refills | Status: AC
  Filled 2024-12-03 (×2): qty 3, 28d supply, fill #0
